# Patient Record
Sex: Male | Born: 1990 | Race: White | Hispanic: No | Marital: Married | Smoking: Never smoker
Health system: Southern US, Community
[De-identification: ages and names within clinical notes are randomized; demographics above are authoritative.]

## PROBLEM LIST (undated history)

## (undated) DIAGNOSIS — S069XAA Unspecified intracranial injury with loss of consciousness status unknown, initial encounter: Secondary | ICD-10-CM

## (undated) HISTORY — PX: SHOULDER ARTHROSCOPY: SHX128

---
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
## 2013-05-27 ENCOUNTER — Encounter: Payer: Self-pay | Admitting: Family Medicine

## 2013-05-27 ENCOUNTER — Ambulatory Visit (INDEPENDENT_AMBULATORY_CARE_PROVIDER_SITE_OTHER): Payer: Federal, State, Local not specified - PPO | Admitting: Family Medicine

## 2013-05-27 VITALS — BP 90/59 | HR 52 | Temp 97.5°F | Ht 71.5 in | Wt 145.4 lb

## 2013-05-27 DIAGNOSIS — Z0000 Encounter for general adult medical examination without abnormal findings: Secondary | ICD-10-CM

## 2013-05-27 NOTE — Patient Instructions (Signed)
Testicular Problems and Self-Exam   Men can examine themselves easily and effectively with positive results. Monthly exams detect problems early and save lives. There are numerous causes of swelling in the testicle. Testicular cancer usually appears as a firm painless lump in the front part of the testicle. This may feel like a dull ache or heavy feeling located in the lower abdomen (belly), groin, or scrotum.   The risk is greater in men with undescended testicles and it is more common in young men. It is responsible for almost a fifth of cancers in males between ages 15 and 34. Other common causes of swellings, lumps, and testicular pain include injuries, inflammation (soreness) from infection, hydrocele, and torsion. These are a few of the reasons to do monthly self-examination of the testicles. The exam only takes minutes and could add years to your life. Get in the habit!   SELF-EXAMINATION OF THE TESTICLES   The testicles are easiest to examine after warm baths or showers and are more difficult to examine when you are cold. This is because the muscles attached to the testicles retract and pull them up higher or into the abdomen. While standing, roll one testicle between the thumb and forefinger. Feel for lumps, swelling, or discomfort. A normal testicle is egg shaped and feels firm. It is smooth and not tender. The spermatic cord can be felt as a firm spaghetti-like cord at the back of the testicle. It is also important to examine your groins. This is the crease between the front of your leg and your abdomen. Also, feel for enlarged lymph nodes (glands). Enlarged nodes are also a cause for you to see your caregiver for evaluation.   Self-examination of the testicles and groin areas on a regular basis will help you to know what your own testicles and groins feel like. This will help you pick up an abnormality (difference) at an earlier stage. Early discovery is the key to curing this cancer or treating other  conditions. Any lump, change, or swelling in the testicle calls for immediate evaluation by your caregiver. Cancer of the testicle does not result in impotence and it does not prevent normal intercourse or prevent having children. If your caregiver feels that medical treatment or chemotherapy could lead to infertility, sperm can be frozen for future use. It is necessary to see a caregiver as soon as possible after the discovery of a lump in a testicle.   Document Released: 12/08/2000 Document Revised: 11/24/2011 Document Reviewed: 09/02/2008   ExitCare® Patient Information ©2014 [employer].

## 2013-05-27 NOTE — Progress Notes (Signed)
  Subjective:    Patient ID: Christian Lang, male    DOB: 1991-08-04, 22 y.o.   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   Review of Systems No chest pain, SOB, HA, dizziness, vision change, N/V, diarrhea, constipation, dysuria, urinary urgency or frequency, myalgias, arthralgias or rash.     Objective:   Physical Exam Vital signs noted  Well developed well nourished male.  HEENT - Head atraumatic Normocephalic                Eyes - PERRLA, Conjuctiva - clear Sclera- Clear EOMI                Ears - EAC's Wnl TM's Wnl Gross Hearing WNL                Nose - Nares patent                 Throat - oropharanx wnl Respiratory - Lungs CTA bilateral Cardiac - RRR S1 and S2 without murmur GI - Abdomen soft Nontender and bowel sounds active x 4 Extremities - No edema. Neuro - Grossly intact.       Assessment & Plan:  Physical exam Clear for [employer] in [place].  Paper work filled out.

## 2013-07-18 ENCOUNTER — Ambulatory Visit (INDEPENDENT_AMBULATORY_CARE_PROVIDER_SITE_OTHER): Payer: Federal, State, Local not specified - PPO | Admitting: Physician Assistant

## 2013-07-18 ENCOUNTER — Ambulatory Visit: Payer: Federal, State, Local not specified - PPO

## 2013-07-18 VITALS — BP 112/68 | HR 74 | Temp 98.0°F | Resp 18 | Ht 71.5 in | Wt 144.2 lb

## 2013-07-18 DIAGNOSIS — M545 Low back pain: Secondary | ICD-10-CM

## 2013-07-18 DIAGNOSIS — S39012A Strain of muscle, fascia and tendon of lower back, initial encounter: Secondary | ICD-10-CM

## 2013-07-18 DIAGNOSIS — S335XXA Sprain of ligaments of lumbar spine, initial encounter: Secondary | ICD-10-CM

## 2013-07-18 MED ORDER — DICLOFENAC SODIUM 75 MG PO TBEC: 75.0000 mg | DELAYED_RELEASE_TABLET | Freq: Two times a day (BID) | ORAL | Status: DC

## 2013-07-18 MED ORDER — CYCLOBENZAPRINE HCL 5 MG PO TABS: 5.0000 mg | ORAL_TABLET | Freq: Three times a day (TID) | ORAL | Status: DC | PRN

## 2013-07-18 MED ORDER — TRAMADOL HCL 50 MG PO TABS: 50.0000 mg | ORAL_TABLET | Freq: Three times a day (TID) | ORAL | Status: DC | PRN

## 2013-07-18 NOTE — Progress Notes (Signed)
   [address], [place] [address]   Phone [phone]  Subjective:    Patient ID: Christian Lang, male    DOB: 03-23-1991, 22 y.o.   MRN: [id number]  HPI Pt presents to clinic with 1 day h/o lumbar back pain after he was chopping wood and after his axe got stuck in a large piece of wood he picked up the piece of wood to disengage the ax and he felt a pull in his back.  He has no pain radiation into his legs or buttocks.  He has taken no medications today.  He has h/o back problems and been on NSAIDs and pain meds and muscle relaxers. He has only had xrays in the past and never an MRI. Increased pain with movements - he cannot find a comfortable position.  No B/B incontience Past history - SI joint problems -  Pt seen at the [place]  Review of Systems  Musculoskeletal: Positive for back pain and gait problem.       Objective:   Physical Exam  Constitutional: He is oriented to person, place, and time.  HENT:  Head: Normocephalic and atraumatic.  Right Ear: External ear normal.  Left Ear: External ear normal.  Pulmonary/Chest: Effort normal.  Musculoskeletal:       Lumbar back: He exhibits decreased range of motion (2nd to pain pt is unable to flex or extend his lumbar spine - he is able to side bend without much discomfort.  Pt has slow movements with any position change.). He exhibits no tenderness, no bony tenderness and no spasm.  Neurological: He is alert and oriented to person, place, and time.  Skin: Skin is warm and dry.  Psychiatric: He has a normal mood and affect. His behavior is normal. Judgment and thought content normal.   [facility] reading (PRIMARY) by  Dr. Alwyn Ren.  Disc space maintained. NL films.     Assessment & Plan:  Lumbar pain - Plan: DG Lumbar Spine 2-3 Views, cyclobenzaprine (FLEXERIL) 5 MG tablet, diclofenac (VOLTAREN) 75 MG EC tablet, traMADol (ULTRAM) 50 MG tablet  Pt to rest and take it easy and use heat on his back.  We discussed reasons to RTC and  worsening symptoms.  He will either return to clinic here or the [place] in 1 week or sooner if symptoms worsen.  Benny Lennert PA-C 07/18/2013 9:43 PM

## 2014-02-20 ENCOUNTER — Ambulatory Visit: Payer: Federal, State, Local not specified - PPO | Admitting: Family Medicine

## 2014-02-20 ENCOUNTER — Encounter: Payer: Self-pay | Admitting: Family Medicine

## 2014-02-20 VITALS — BP 114/72 | HR 44 | Temp 97.3°F | Ht 71.0 in | Wt 146.2 lb

## 2014-02-20 DIAGNOSIS — Z0000 Encounter for general adult medical examination without abnormal findings: Secondary | ICD-10-CM

## 2014-02-20 LAB — POCT URINALYSIS DIPSTICK
Bilirubin, UA: NEGATIVE
Blood, UA: NEGATIVE
Glucose, UA: NEGATIVE
Ketones, UA: NEGATIVE
Leukocytes, UA: NEGATIVE
Nitrite, UA: NEGATIVE
Protein, UA: NEGATIVE
Spec Grav, UA: 1.015
Urobilinogen, UA: NEGATIVE
pH, UA: 7.5
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
## 2014-07-23 ENCOUNTER — Encounter (HOSPITAL_COMMUNITY): Payer: Self-pay | Admitting: Emergency Medicine

## 2014-07-23 ENCOUNTER — Emergency Department (HOSPITAL_COMMUNITY)
Admission: EM | Admit: 2014-07-23 | Discharge: 2014-07-23 | Disposition: A | Discharge status: Home / Self Care | Payer: Federal, State, Local not specified - PPO | Attending: Emergency Medicine | Admitting: Emergency Medicine

## 2014-07-23 DIAGNOSIS — Y9389 Activity, other specified: Secondary | ICD-10-CM | POA: Insufficient documentation

## 2014-07-23 DIAGNOSIS — Y9289 Other specified places as the place of occurrence of the external cause: Secondary | ICD-10-CM | POA: Insufficient documentation

## 2014-07-23 DIAGNOSIS — T593X1A Toxic effect of lacrimogenic gas, accidental (unintentional), initial encounter: Secondary | ICD-10-CM | POA: Insufficient documentation

## 2014-07-23 DIAGNOSIS — T65894A Toxic effect of other specified substances, undetermined, initial encounter: Secondary | ICD-10-CM

## 2014-07-23 DIAGNOSIS — H5713 Ocular pain, bilateral: Secondary | ICD-10-CM | POA: Diagnosis present

## 2014-07-23 DIAGNOSIS — Y998 Other external cause status: Secondary | ICD-10-CM | POA: Insufficient documentation

## 2014-07-23 MED ORDER — TETRACAINE HCL 0.5 % OP SOLN
2.0000 [drp] | Freq: Once | OPHTHALMIC | Status: AC
  Administered 2014-07-23: 2 [drp] via OPHTHALMIC
  Filled 2014-07-23: qty 2
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
## 2015-05-09 ENCOUNTER — Encounter (INDEPENDENT_AMBULATORY_CARE_PROVIDER_SITE_OTHER): Payer: Self-pay

## 2015-05-09 ENCOUNTER — Ambulatory Visit (INDEPENDENT_AMBULATORY_CARE_PROVIDER_SITE_OTHER): Payer: BLUE CROSS/BLUE SHIELD

## 2015-05-09 ENCOUNTER — Ambulatory Visit (INDEPENDENT_AMBULATORY_CARE_PROVIDER_SITE_OTHER): Payer: BLUE CROSS/BLUE SHIELD | Admitting: Family Medicine

## 2015-05-09 ENCOUNTER — Encounter: Payer: Self-pay | Admitting: Family Medicine

## 2015-05-09 VITALS — BP 109/67 | HR 64 | Temp 97.3°F | Ht 71.0 in | Wt 146.2 lb

## 2015-05-09 DIAGNOSIS — R1011 Right upper quadrant pain: Secondary | ICD-10-CM

## 2015-05-09 LAB — POCT UA - MICROSCOPIC ONLY
Bacteria, U Microscopic: NEGATIVE
Casts, Ur, LPF, POC: NEGATIVE
Crystals, Ur, HPF, POC: NEGATIVE
WBC, Ur, HPF, POC: NEGATIVE
Yeast, UA: NEGATIVE

## 2015-05-09 LAB — POCT URINALYSIS DIPSTICK
Bilirubin, UA: NEGATIVE
Glucose, UA: NEGATIVE
Ketones, UA: NEGATIVE
Leukocytes, UA: NEGATIVE
Nitrite, UA: NEGATIVE
Protein, UA: NEGATIVE
Spec Grav, UA: 1.02
Urobilinogen, UA: NEGATIVE
pH, UA: 5
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
## 2015-08-17 ENCOUNTER — Ambulatory Visit: Payer: BLUE CROSS/BLUE SHIELD | Admitting: Family Medicine

## 2015-08-17 ENCOUNTER — Ambulatory Visit (INDEPENDENT_AMBULATORY_CARE_PROVIDER_SITE_OTHER): Payer: BLUE CROSS/BLUE SHIELD | Admitting: Pediatrics

## 2015-08-17 VITALS — BP 136/69 | HR 49 | Temp 97.3°F | Ht 71.0 in | Wt 146.6 lb

## 2015-08-17 DIAGNOSIS — H8110 Benign paroxysmal vertigo, unspecified ear: Secondary | ICD-10-CM | POA: Diagnosis not present

## 2015-08-17 MED ORDER — MECLIZINE HCL 12.5 MG PO TABS: 12.5000 mg | ORAL_TABLET | Freq: Three times a day (TID) | ORAL | Status: DC | PRN
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
## 2015-08-17 NOTE — Patient Instructions (Signed)
Epley Maneuver Self-Care  WHAT IS THE EPLEY MANEUVER?  The Epley maneuver is an exercise you can do to relieve symptoms of benign paroxysmal positional vertigo (BPPV). This condition is often just referred to as vertigo. BPPV is caused by the movement of tiny crystals (canaliths) inside your inner ear. The accumulation and movement of canaliths in your inner ear causes a sudden spinning sensation (vertigo) when you move your head to certain positions. Vertigo usually lasts about 30 seconds. BPPV usually occurs in just one ear. If you get vertigo when you lie on your left side, you probably have BPPV in your left ear. Your health care provider can tell you which ear is involved.   BPPV may be caused by a head injury. Many people older than 50 get BPPV for unknown reasons. If you have been diagnosed with BPPV, your health care provider may teach you how to do this maneuver. BPPV is not life threatening (benign) and usually goes away in time.   WHEN SHOULD I PERFORM THE EPLEY MANEUVER?  You can do this maneuver at home whenever you have symptoms of vertigo. You may do the Epley maneuver up to 3 times a day until your symptoms of vertigo go away.  HOW SHOULD I DO THE EPLEY MANEUVER?  1. Sit on the edge of a bed or table with your back straight. Your legs should be extended or hanging over the edge of the bed or table.    2. Turn your head halfway toward the affected ear.    3. Lie backward quickly with your head turned until you are lying flat on your back. You may want to position a pillow under your shoulders.    4. Hold this position for 30 seconds. You may experience an attack of vertigo. This is normal. Hold this position until the vertigo stops.  5. Then turn your head to the opposite direction until your unaffected ear is facing the floor.    6. Hold this position for 30 seconds. You may experience an attack of vertigo. This is normal. Hold this position until the vertigo stops.  7. Now turn your whole body to  the same side as your head. Hold for another 30 seconds.    8. You can then sit back up.  ARE THERE RISKS TO THIS MANEUVER?  In some cases, you may have other symptoms (such as changes in your vision, weakness, or numbness). If you have these symptoms, stop doing the maneuver and call your health care provider. Even if doing these maneuvers relieves your vertigo, you may still have dizziness. Dizziness is the sensation of light-headedness but without the sensation of movement. Even though the Epley maneuver may relieve your vertigo, it is possible that your symptoms will return within 5 years.  WHAT SHOULD I DO AFTER THIS MANEUVER?  After doing the Epley maneuver, you can return to your normal activities. Ask your doctor if there is anything you should do at home to prevent vertigo. This may include:  · Sleeping with two or more pillows to keep your head elevated.  · Not sleeping on the side of your affected ear.  · Getting up slowly from bed.  · Avoiding sudden movements during the day.  · Avoiding extreme head movement, like looking up or bending over.  · Wearing a cervical collar to prevent sudden head movements.  WHAT SHOULD I DO IF MY SYMPTOMS GET WORSE?  Call your health care provider if your vertigo gets worse. Call your provider right way if   you have other symptoms, including:   · Nausea.  · Vomiting.  · Headache.  · Weakness.  · Numbness.  · Vision changes.     This information is not intended to replace advice given to you by your health care provider. Make sure you discuss any questions you have with your health care provider.     Document Released: 09/06/2013 Document Reviewed: 09/06/2013  Elsevier Interactive Patient Education ©2016 [employer]

## 2017-10-02 ENCOUNTER — Ambulatory Visit (INDEPENDENT_AMBULATORY_CARE_PROVIDER_SITE_OTHER): Payer: Commercial Managed Care - PPO | Admitting: Family Medicine

## 2017-10-02 ENCOUNTER — Ambulatory Visit (INDEPENDENT_AMBULATORY_CARE_PROVIDER_SITE_OTHER): Payer: Commercial Managed Care - PPO

## 2017-10-02 ENCOUNTER — Encounter: Payer: Self-pay | Admitting: Family Medicine

## 2017-10-02 VITALS — BP 127/81 | HR 84 | Temp 97.3°F | Ht 71.0 in | Wt 155.0 lb

## 2017-10-02 DIAGNOSIS — R059 Cough, unspecified: Secondary | ICD-10-CM

## 2017-10-02 DIAGNOSIS — R042 Hemoptysis: Secondary | ICD-10-CM

## 2017-10-02 DIAGNOSIS — J069 Acute upper respiratory infection, unspecified: Secondary | ICD-10-CM | POA: Diagnosis not present

## 2017-10-02 DIAGNOSIS — R05 Cough: Secondary | ICD-10-CM | POA: Diagnosis not present

## 2017-10-02 MED ORDER — BENZONATATE 200 MG PO CAPS: 200.0000 mg | ORAL_CAPSULE | Freq: Two times a day (BID) | ORAL | 0 refills | Status: DC | PRN

## 2017-10-02 NOTE — Patient Instructions (Addendum)
Chest xray did not reveal any acute abnormalities.  I am still waiting on the formal review by the radiologist will contact you once that is available.  For now, I have prescribed you Tessalon Perles to use twice daily if needed for cough.  This does not cause sedation and is safe to use during your work.  You should consider taking a day or 2 off for rest and hydration.  You may take naproxen if needed for pain or fevers.  Consider getting Sudafed from behind the counter to help with sinus pressure/ear fullness.  It appears that you have a viral upper respiratory infection (cold).  Cold symptoms can last up to 2 weeks.    - Get plenty of rest and drink plenty of fluids. - Try to breathe moist air. Use a cold mist humidifier. - Consume warm fluids (soup or tea) to provide relief for a stuffy nose and to loosen phlegm. - For nasal stuffiness, try saline nasal spray or a Neti Pot. Afrin nasal spray can also be used but this product should not be used longer than 3 days or it will cause rebound nasal stuffiness (worsening nasal congestion). - For sore throat pain relief: suck on throat lozenges, hard candy or popsicles; gargle with warm salt water (1/4 tsp. salt per 8 oz. of water); and eat soft, bland foods. - Eat a well-balanced diet. If you cannot, ensure you are getting enough nutrients by taking a daily multivitamin. - Avoid dairy products, as they can thicken phlegm. - Avoid alcohol, as it impairs your body's immune system.  CONTACT YOUR DOCTOR IF YOU EXPERIENCE ANY OF THE FOLLOWING: - High fever - Ear pain - Sinus-type headache - Unusually severe cold symptoms - Cough that gets worse while other cold symptoms improve - Flare up of any chronic lung problem, such as asthma - Your symptoms persist longer than 2 weeks

## 2017-10-02 NOTE — Progress Notes (Signed)
Subjective: CC: cough/ congestion PCP: Junie SpencerHawks, Christy A, FNP RUE:[id number]:Christian Carollee MassedS Lang is a 27 y.o. male presenting to clinic today for:  1. Cold symptoms  Patient reports sinus/nasal congestion, cough, ear pressure/muffling that started 1 week ago.  He reports 2 episodes of scant hemoptysis, the last being this morning.  He reports one episode of nonbloody diarrhea.  He notes chills.  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  He thinks that this compounded his symptoms.  He denies chest congestion, rhinorrhea, headache, SOB, dizziness, rash, nausea, vomiting, fevers, myalgia, sick contacts, recent travel.  Patient has used Mucinex with little relief of symptoms.  Denies history of COPD or asthma.  Denies tobacco use/ exposure.  ROS: Per HPI  Allergies  Allergen Reactions  . Hydrocodone Hives   No past medical history on file.  Current Outpatient Medications:  .  meclizine (ANTIVERT) 12.5 MG tablet, Take 1 tablet (12.5 mg total) by mouth 3 (three) times daily as needed for dizziness., Disp: 30 tablet, Rfl: 0 Social History   Socioeconomic History  . Marital status: Single    Spouse name: Not on file  . Number of children: Not on file  . Years of education: Not on file  . Highest education level: Not on file  Social Needs  . Financial resource strain: Not on file  . Food insecurity - worry: Not on file  . Food insecurity - inability: Not on file  . Transportation needs - medical: Not on file  . Transportation needs - non-medical: Not on file  Occupational History  . Not on file  Tobacco Use  . Smoking status: Never Smoker  . Smokeless tobacco: Never Used  Substance and Sexual Activity  . Alcohol use: No  . Drug use: No  . Sexual activity: Not on file  Other Topics Concern  . Not on file  Social History Narrative  . Not on  file   No family history on file.  Objective: Office vital signs reviewed. BP 127/81   Pulse 84   Temp (!) 97.3 F (36.3 C) (Oral)   Ht 5\' 11"  (1.803 m)   Wt 155 lb (70.3 kg)   SpO2 99%   BMI 21.62 kg/m   Physical Examination:  General: Awake, alert, well nourished, well appearing male, No acute distress HEENT: Normal    Neck: No masses palpated. + right anterior cervical lymph node enlargement    Ears: L Tympanic membranes intact, normal light reflex, no erythema, no bulging; R TM obscured by cerumen/dry skin    Eyes: PERRLA, extraocular membranes intact, sclera white, no ocular discharge    Nose: nasal turbinates moist, no nasal discharge    Throat: moist mucus membranes, mild oropharyngeal erythema, no tonsillar exudate.  Airway is patent Cardio: regular rate and rhythm, S1S2 heard, no murmurs appreciated Pulm: clear to auscultation bilaterally, no wheezes, rhonchi or rales; normal work of breathing on room air  Dg Chest 2 View  Result Date: 10/02/2017 CLINICAL DATA:  Cough and chest congestion.  Hemoptysis. EXAM: CHEST  2 VIEW COMPARISON:  07/23/2004 FINDINGS: The heart size and mediastinal contours are within normal limits. Both lungs are clear. The visualized skeletal structures are unremarkable. IMPRESSION: Normal exam. Electronically Signed   By: Francene BoyersJames  Maxwell M.D.   On: 10/02/2017 09:13    Assessment/ Plan: 27 y.o. male  1. URI with cough and congestion Appears to be viral.  Patient is afebrile well-appearing with normal vital signs during today's visit.  His physical exam was remarkable for a slightly enlarged right anterior cervical lymph node.  Right TM was also obscured by cerumen.  No evidence of bacterial infection on exam.  Because of his reports of 2 episodes of hemoptysis, chest x-ray was obtained to rule out atypical pneumonia versus other pathologic processes.  Personal review of chest x-ray was within normal limits.  Radiology review also noted no acute  pulmonary processes.  Supportive care recommended.  Home care instructions were reviewed with the patient.  Tessalon Perles p.o. twice daily prescribed as needed cough.  Not having any chest congestion so recommended that he actually discontinue Mucinex.  Would consider starting Sudafed for sinus pressure.  A work note was provided excusing for the next 2 days.  I did recommend that he consider rest and hydration in order to recover from this illness. - DG Chest 2 View; Future  2. Hemoptysis - DG Chest 2 View; Future   Orders Placed This Encounter  Procedures  . DG Chest 2 View    Standing Status:   Future    Number of Occurrences:   1    Standing Expiration Date:   12/01/2018    Order Specific Question:   Reason for Exam (SYMPTOM  OR DIAGNOSIS REQUIRED)    Answer:   hemoptysis x2    Order Specific Question:   Preferred imaging location?    Answer:   Internal    Order Specific Question:   Radiology Contrast Protocol - do NOT remove file path    Answer:   [email or web address]   Meds ordered this encounter  Medications  . benzonatate (TESSALON) 200 MG capsule    Sig: Take 1 capsule (200 mg total) by mouth 2 (two) times daily as needed for cough.    Dispense:  20 capsule    Refill:  0     Christian Bojanowski Hulen Skains, DO [facility] [phone]

## 2017-10-06 ENCOUNTER — Telehealth: Payer: Self-pay | Admitting: Family Medicine

## 2017-10-06 ENCOUNTER — Other Ambulatory Visit: Payer: Self-pay | Admitting: Family Medicine

## 2017-10-06 MED ORDER — AZITHROMYCIN 250 MG PO TABS
ORAL_TABLET | ORAL | 0 refills | Status: DC
Start: 2017-10-06 — End: 2018-01-19

## 2017-10-06 NOTE — Telephone Encounter (Signed)
[facility]

## 2017-10-06 NOTE — Telephone Encounter (Signed)
What symptoms do you have? Pain in face, excessive head congestion  How long have you been sick? 11 days  Have you been seen for this problem? Yes, by Dr. Reece AgarG  If your provider decides to give you a prescription, which pharmacy would you like for it to be sent to? [facility]   Patient informed that this information will be sent to the clinical staff for review and that they should receive a follow up call.

## 2017-10-07 ENCOUNTER — Telehealth: Payer: Self-pay | Admitting: Family Medicine

## 2017-10-08 NOTE — Telephone Encounter (Signed)
lmcb- need more information

## 2017-10-08 NOTE — Telephone Encounter (Signed)
Did pt start the Zpak that was sent in on 10/06/17?

## 2017-10-08 NOTE — Telephone Encounter (Signed)
Detailed message left for patient.

## 2017-10-08 NOTE — Telephone Encounter (Signed)
Patient states he is still has a lot of congestion, facial pressure/pain, ears popping, hollow sound like he is talking through water. Patient has tried sudafed and mucinex

## 2017-10-08 NOTE — Telephone Encounter (Signed)
Patient will pick up zpack today and start. Patient had not picked up yet.

## 2018-01-19 ENCOUNTER — Encounter (HOSPITAL_COMMUNITY): Payer: Self-pay | Admitting: *Deleted

## 2018-01-19 ENCOUNTER — Other Ambulatory Visit: Payer: Self-pay

## 2018-01-19 ENCOUNTER — Emergency Department (HOSPITAL_COMMUNITY)
Admission: EM | Admit: 2018-01-19 | Discharge: 2018-01-19 | Disposition: A | Discharge status: Home / Self Care | Payer: Worker's Compensation | Attending: Emergency Medicine | Admitting: Emergency Medicine

## 2018-01-19 ENCOUNTER — Emergency Department (HOSPITAL_COMMUNITY): Payer: Worker's Compensation

## 2018-01-19 DIAGNOSIS — S5412XA Injury of median nerve at forearm level, left arm, initial encounter: Secondary | ICD-10-CM | POA: Diagnosis not present

## 2018-01-19 DIAGNOSIS — R531 Weakness: Secondary | ICD-10-CM | POA: Diagnosis present

## 2018-01-19 DIAGNOSIS — Y999 Unspecified external cause status: Secondary | ICD-10-CM | POA: Insufficient documentation

## 2018-01-19 DIAGNOSIS — Y9389 Activity, other specified: Secondary | ICD-10-CM | POA: Diagnosis not present

## 2018-01-19 DIAGNOSIS — R202 Paresthesia of skin: Secondary | ICD-10-CM | POA: Diagnosis not present

## 2018-01-19 DIAGNOSIS — Y828 Other medical devices associated with adverse incidents: Secondary | ICD-10-CM | POA: Insufficient documentation

## 2018-01-19 DIAGNOSIS — Y92538 Other ambulatory health services establishments as the place of occurrence of the external cause: Secondary | ICD-10-CM | POA: Insufficient documentation

## 2018-01-19 MED ORDER — IOPAMIDOL (ISOVUE-370) INJECTION 76%
100.0000 mL | Freq: Once | INTRAVENOUS | Status: AC | PRN
  Administered 2018-01-19: 100 mL via INTRAVENOUS

## 2018-01-19 NOTE — ED Provider Notes (Signed)
Emergency Department Provider Note   I have reviewed the triage vital signs and the nursing notes.   HISTORY  Chief Complaint Extremity Weakness   HPI Christian Lang is a 27 y.o. male who gave blood earlier today and will given blood had a little bit worse pain than normal but other than that the blood draw was uneventful.  Afterwards she has a little weakness in his arm which was used to paresthesias as well.  However the next 5 to 6 hours he has had intermittent episodes of feel like his hand is cold even though his wife touches it and it is not.  He had multiple episodes of paresthesias down his arm and also decreased grip strength.  He talked to a tele-doc who told to come the emergency room for evaluation.  He did note that at one point it seemed like his arms were different colors when they hurt.  They took a picture however it is difficult to tell if they are truly different colors.  There is definitely not severe cyanosis in the pictures. No other associated or modifying symptoms.    History reviewed. No pertinent past medical history.  There are no active problems to display for this patient.   Past Surgical History:  Procedure Laterality Date  . SHOULDER ARTHROSCOPY Left       Allergies Hydrocodone  No family history on file.  Social History Social History   Tobacco Use  . Smoking status: Never Smoker  . Smokeless tobacco: Never Used  Substance Use Topics  . Alcohol use: No  . Drug use: No    Review of Systems  All other systems negative except as documented in the HPI. All pertinent positives and negatives as reviewed in the HPI. ____________________________________________   PHYSICAL EXAM:  VITAL SIGNS: ED Triage Vitals  Enc Vitals Group     BP 01/19/18 2020 136/82     Pulse Rate 01/19/18 2020 61     Resp 01/19/18 2020 18     Temp 01/19/18 2020 98.2 F (36.8 C)     Temp src --      SpO2 01/19/18 2020 99 %     Weight 01/19/18 2021 153  lb (69.4 kg)     Height 01/19/18 2021 6' (1.829 m)    Constitutional: Alert and oriented. Well appearing and in no acute distress. Eyes: Conjunctivae are normal. PERRL. EOMI. Head: Atraumatic. Nose: No congestion/rhinnorhea. Mouth/Throat: Mucous membranes are moist.  Oropharynx non-erythematous. Neck: No stridor.  No meningeal signs.   Cardiovascular: Normal rate, regular rhythm. Good peripheral circulation. Grossly normal heart sounds.   Respiratory: Normal respiratory effort.  No retractions. Lungs CTAB. Gastrointestinal: Soft and nontender. No distention.  Musculoskeletal: No lower extremity tenderness nor edema. No gross deformities of extremities. Neurologic:  Normal speech and language. No gross focal neurologic deficits are appreciated. Decreased left grip strength and wrist flexion. Skin:  Skin is warm, dry and intact. No rash noted.  ____________________________________________  RADIOLOGY  Ct Angio Up Extrem Left W &/or Wo Contast  Result Date: 01/19/2018 CLINICAL DATA:  27 year old male is EXAM: CT ANGIOGRAPHY OF THE UPPER LEFT EXTREMITY WITHOUT CONTRAST TECHNIQUE: Multidetector CT imaging of the upper left extremity was performed following intravenous administration of contrast according to the standard protocol. CONTRAST:  100 cc Isovue 370 COMPARISON:  None. FINDINGS: Bones/Joint/Cartilage There is no acute fracture or dislocation. The bones are well mineralized. No arthritic changes. No joint effusion. Ligaments Suboptimally assessed by CT. Muscles and Tendons  No acute findings.  No intramuscular fluid collection or hematoma. Soft tissues There is mild subcutaneous edema at the wrist.  No fluid collection. The visualized left axillary, brachial, radial and ulnar arteries are unremarkable and patent to the level of the wrist. IMPRESSION: No acute findings.  No fluid collection. Electronically Signed   By: Elgie Collard M.D.   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  Discussed with Dr. Laurence Slate at [facility] neurology who says he needs outpatient neurology follow-up for likely nerve conduction studies.  No acute interventions. CT w/o e/o vascular injury.    Pertinent labs & imaging results that were available during my care of the patient were reviewed by me and considered in my medical decision making (see chart for details).  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   Note:  This note was prepared with assistance of Dragon voice recognition software. Occasional wrong-word or sound-a-like substitutions may have occurred due to the inherent limitations of voice recognition software.   Amdrew Oboyle, Barbara Cower, MD 01/20/18 0005

## 2018-01-19 NOTE — ED Triage Notes (Addendum)
Pt reports having his blood taken earlier today and his left arm has been numb, cold and weak ever since. Pt states he has a hard time even holding a cup.

## 2018-07-19 ENCOUNTER — Encounter: Payer: Self-pay | Admitting: Family Medicine

## 2018-07-19 ENCOUNTER — Ambulatory Visit (INDEPENDENT_AMBULATORY_CARE_PROVIDER_SITE_OTHER): Payer: Commercial Managed Care - PPO | Admitting: Family Medicine

## 2018-07-19 VITALS — BP 104/56 | HR 56 | Temp 97.1°F | Ht 72.0 in | Wt 157.0 lb

## 2018-07-19 DIAGNOSIS — H6593 Unspecified nonsuppurative otitis media, bilateral: Secondary | ICD-10-CM

## 2018-07-19 MED ORDER — FLUTICASONE PROPIONATE 50 MCG/ACT NA SUSP: 2.0000 | Freq: Every day | NASAL | 6 refills | Status: DC

## 2018-07-19 MED ORDER — CETIRIZINE HCL 10 MG PO TABS: 10.0000 mg | ORAL_TABLET | Freq: Every day | ORAL | 11 refills | Status: DC

## 2018-07-19 NOTE — Progress Notes (Signed)
Subjective:    Patient ID: Christian Lang, male    DOB: [birth year], 27 y.o.   MRN: [id number]  Chief Complaint:  Wants ears checked   HPI: Christian Lang is a 27 y.o. male presenting on 07/19/2018 for Wants ears checked   1. Bilateral otitis media with effusion   Pt presents today with complaints of clicking noises in ears. States on 07-15-18 he had an episode of dizziness when he got up from bed. States this subsided. Denies nausea or vomiting with the dizziness. States the room felt as if it was spinning. States since this episode he has had the popping and clicking noises in his ears. States this is intermittent. Denies weakness, numbness, tingling, focal neuro deficits, or headaches.   Relevant past medical, surgical, family, and social history reviewed and updated as indicated.  Allergies and medications reviewed and updated.   History reviewed. No pertinent past medical history.  Past Surgical History:  Procedure Laterality Date  . SHOULDER ARTHROSCOPY Left     Social History   Socioeconomic History  . Marital status: Single    Spouse name: Not on file  . Number of children: Not on file  . Years of education: Not on file  . Highest education level: Not on file  Occupational History  . Not on file  Social Needs  . Financial resource strain: Not on file  . Food insecurity:    Worry: Not on file    Inability: Not on file  . Transportation needs:    Medical: Not on file    Non-medical: Not on file  Tobacco Use  . Smoking status: Never Smoker  . Smokeless tobacco: Never Used  Substance and Sexual Activity  . Alcohol use: No  . Drug use: No  . Sexual activity: Not on file  Lifestyle  . Physical activity:    Days per week: Not on file    Minutes per session: Not on file  . Stress: Not on file  Relationships  . Social connections:    Talks on phone: Not on file    Gets together: Not on file    Attends religious service: Not on file    Active  member of club or organization: Not on file    Attends meetings of clubs or organizations: Not on file    Relationship status: Not on file  . Intimate partner violence:    Fear of current or ex partner: Not on file    Emotionally abused: Not on file    Physically abused: Not on file    Forced sexual activity: Not on file  Other Topics Concern  . Not on file  Social History Narrative  . Not on file    Outpatient Encounter Medications as of 07/19/2018  Medication Sig  . cetirizine (ZYRTEC) 10 MG tablet Take 1 tablet (10 mg total) by mouth daily.  . fluticasone (FLONASE) 50 MCG/ACT nasal spray Place 2 sprays into both nostrils daily.   No facility-administered encounter medications on file as of 07/19/2018.     Allergies  Allergen Reactions  . Hydrocodone Hives    Review of Systems  Constitutional: Negative for appetite change, chills, fatigue and fever.  HENT: Positive for tinnitus. Negative for congestion, postnasal drip, rhinorrhea, sinus pressure and sinus pain.   Eyes: Negative for photophobia and visual disturbance.  Respiratory: Negative for cough, chest tightness and shortness of breath.   Cardiovascular: Negative for chest pain, palpitations and leg swelling.  Gastrointestinal:  Negative for nausea and vomiting.  Neurological: Positive for dizziness. Negative for tremors, seizures, syncope, facial asymmetry, speech difficulty, weakness, light-headedness, numbness and headaches.  All other systems reviewed and are negative.       Objective:    BP (!) 104/56   Pulse (!) 56   Temp (!) 97.1 F (36.2 C) (Oral)   Ht 6' (1.829 m)   Wt 157 lb (71.2 kg)   BMI 21.29 kg/m    Wt Readings from Last 3 Encounters:  07/19/18 157 lb (71.2 kg)  01/19/18 153 lb (69.4 kg)  10/02/17 155 lb (70.3 kg)    Physical Exam  Constitutional: He is oriented to person, place, and time. He appears well-developed and well-nourished. He is cooperative. No distress.  HENT:  Head:  Normocephalic and atraumatic.  Right Ear: Hearing, external ear and ear canal normal. Tympanic membrane is not injected, not perforated and not erythematous. A middle ear effusion is present.  Left Ear: Hearing, external ear and ear canal normal. Tympanic membrane is not injected, not perforated and not erythematous. A middle ear effusion is present.  Nose: Nose normal. Right sinus exhibits no maxillary sinus tenderness and no frontal sinus tenderness. Left sinus exhibits no maxillary sinus tenderness and no frontal sinus tenderness.  Mouth/Throat: Uvula is midline, oropharynx is clear and moist and mucous membranes are normal. No tonsillar exudate.  Eyes: Pupils are equal, round, and reactive to light. Conjunctivae, EOM and lids are normal. Right eye exhibits normal extraocular motion and no nystagmus. Left eye exhibits normal extraocular motion and no nystagmus.  Neck: Trachea normal, full passive range of motion without pain and phonation normal. Neck supple. No JVD present. Carotid bruit is not present. No thyroid mass and no thyromegaly present.  Cardiovascular: Normal rate, regular rhythm and normal heart sounds. Exam reveals no gallop and no friction rub.  No murmur heard. Pulmonary/Chest: Effort normal and breath sounds normal. No respiratory distress.  Musculoskeletal: Normal range of motion.  Lymphadenopathy:    He has no cervical adenopathy.  Neurological: He is alert and oriented to person, place, and time. He has normal strength. No cranial nerve deficit or sensory deficit. He displays a negative Romberg sign. Coordination and gait normal.  Skin: Skin is warm and dry. Capillary refill takes less than 2 seconds.  Psychiatric: He has a normal mood and affect. His speech is normal and behavior is normal. Judgment and thought content normal. Cognition and memory are normal.  Nursing note and vitals reviewed.       Pertinent labs & imaging results that were available during my care of  the patient were reviewed by me and considered in my medical decision making.  Assessment & Plan:  [name] was seen today for wants ears checked.  Diagnoses and all orders for this visit:  Bilateral otitis media with effusion Avoid second hand smoke. Medications as prescribed. Return if symptoms fail to improve.  -     fluticasone (FLONASE) 50 MCG/ACT nasal spray; Place 2 sprays into both nostrils daily. -     cetirizine (ZYRTEC) 10 MG tablet; Take 1 tablet (10 mg total) by mouth daily.      Continue all other maintenance medications.  Follow up plan: Return in about 4 weeks (around 08/16/2018), or if symptoms worsen or fail to improve.  Educational handout given for otitis media with effusion  The above assessment and management plan was discussed with the patient. The patient verbalized understanding of and has agreed to the management plan.  Patient is aware to call the clinic if symptoms persist or worsen. Patient is aware when to return to the clinic for a follow-up visit. Patient educated on when it is appropriate to go to the emergency department.   Kari Baars, FNP-C [facility] [phone]

## 2018-07-19 NOTE — Patient Instructions (Signed)
Otitis Media With Effusion Otitis media with effusion (OME) occurs when there is inflammation of the middle ear and fluid in the middle ear space. There are no signs and symptoms of infection. The middle ear space contains air and the bones for hearing. Air in the middle ear space helps to transmit sound to the brain. OME is a common condition in children, and it often occurs after an ear infection. This condition may be present for several weeks or longer after an ear infection. Most cases of this condition get better on their own. What are the causes? OME is caused by a blockage of the eustachian tube in one or both ears. These tubes drain fluid in the ears to the back of the nose (nasopharynx). If the tissue in the tube swells up (edema), the tube closes. This prevents fluid from draining. Blockage can be caused by:  Ear infections.  Colds and other upper respiratory infections.  Allergies.  Irritants, such as tobacco smoke.  Enlarged adenoids. The adenoids are areas of soft tissue located high in the back of the throat, behind the nose and the roof of the mouth. They are part of the body's natural defense (immune) system.  A mass in the nasopharynx.  Damage to the ear caused by pressure changes (barotrauma).  What increases the risk? Your child is more likely to develop this condition if:  He or she has repeated ear and sinus infections.  He or she has allergies.  He or she is exposed to tobacco smoke.  He or she attends daycare.  He or she is not breastfed.  What are the signs or symptoms? Symptoms of this condition may not be obvious. Sometimes this condition does not have any symptoms, or symptoms may overlap with those of a cold or upper respiratory tract illness. Symptoms of this condition include:  Temporary hearing loss.  A feeling of fullness in the ear without pain.  Irritability or agitation.  Balance (vestibular) problems.  As a result of hearing loss, your  child may:  Listen to the TV at a loud volume.  Not respond to questions.  Ask "What?" often when spoken to.  Mistake or confuse one sound or word for another.  Perform poorly at school.  Have a poor attention span.  Become agitated or irritated easily.  How is this diagnosed? This condition is diagnosed with an ear exam. Your child's health care provider will look inside your child's ear with an instrument (otoscope) to check for redness, swelling, and fluid. Other tests may be done, including:  A test to check the movement of the eardrum (pneumatic otoscopy). This is done by squeezing a small amount of air into the ear.  A test that changes air pressure in the middle ear to check how well the eardrum moves and to see if the eustachian tube is working (tympanogram).  Hearing test (audiogram). This test involves playing tones at different pitches to see if your child can hear each tone.  How is this treated? Treatment for this condition depends on the cause. In many cases, the fluid goes away on its own. In some cases, your child may need a procedure to create a hole in the eardrum to allow fluid to drain (myringotomy) and to insert small drainage tubes (tympanostomy tubes) into the eardrums. These tubes help to drain fluid and prevent infection. This procedure may be recommended if:  OME does not get better over several months.  Your child has many ear infections   within several months.  Your child has noticeable hearing loss.  Your child has problems with speech and language development.  Surgery may also be done to remove the adenoids (adenoidectomy). Follow these instructions at home:  Give over-the-counter and prescription medicines only as told by your child's health care provider.  Keep children away from any tobacco smoke.  Keep all follow-up visits as told by your child's health care provider. This is important. How is this prevented?  Keep your child's  vaccinations up to date. Make sure your child gets all recommended vaccinations, including a pneumonia and flu vaccine.  Encourage hand washing. Your child should wash his or her hands often with soap and water. If there is no soap and water, he or she should use hand sanitizer.  Avoid exposing your child to tobacco smoke.  Breastfeed your baby, if possible. Babies who are breastfed as long as possible are less likely to develop this condition. Contact a health care provider if:  Your child's hearing does not get better after 3 months.  Your child's hearing is worse.  Your child has ear pain.  Your child has a fever.  Your child has drainage from the ear.  Your child is dizzy.  Your child has a lump on his or her neck. Get help right away if:  Your child has bleeding from the nose.  Your child cannot move part of her or his face.  Your child has trouble breathing.  Your child cannot smell.  Your child develops severe congestion.  Your child develops weakness.  Your child who is younger than 3 months has a temperature of 100F (38C) or higher. Summary  Otitis media with effusion (OME) occurs when there is inflammation of the middle ear and fluid in the middle ear space.  This condition is caused by blockage of one or both eustachian tubes, which drain fluid in the ears to the back of the nose.  Symptoms of this condition can include temporary hearing loss, a feeling of fullness in the ear, irritability or agitation, and balance (vestibular) problems. Sometimes, there are no symptoms.  This condition is diagnosed with an ear exam and tests, such as pneumatic otoscopy, tympanogram, and audiogram.  Treatment for this condition depends on the cause. In many cases, the fluid goes away on its own. This information is not intended to replace advice given to you by your health care provider. Make sure you discuss any questions you have with your health care provider. Document  Released: 11/22/2003 Document Revised: 07/24/2016 Document Reviewed: 07/24/2016 Elsevier Interactive Patient Education  2017 [employer]  

## 2018-11-12 ENCOUNTER — Encounter: Payer: Self-pay | Admitting: Nurse Practitioner

## 2018-11-12 ENCOUNTER — Ambulatory Visit (INDEPENDENT_AMBULATORY_CARE_PROVIDER_SITE_OTHER): Payer: Commercial Managed Care - PPO | Admitting: Nurse Practitioner

## 2018-11-12 VITALS — BP 126/77 | HR 53 | Temp 96.7°F | Ht 72.0 in | Wt 162.0 lb

## 2018-11-12 DIAGNOSIS — H3509 Other intraretinal microvascular abnormalities: Secondary | ICD-10-CM | POA: Diagnosis not present

## 2018-11-12 NOTE — Progress Notes (Signed)
   Subjective:    Patient ID: Christian Lang, male    DOB: 26-Jun-1991, 28 y.o.   MRN: [id number]   Chief Complaint: Right eye red   HPI Patient come sin today c/o of red streak in his right eye. He though he had something in it 1 week ago and now has red line that has ben there for several days. He denies pain, itching or irritation.   Review of Systems  Constitutional: Negative.   Eyes: Negative for photophobia, pain, discharge, redness and itching.  Respiratory: Negative.   Cardiovascular: Negative.   Genitourinary: Negative.   Neurological: Negative.   Psychiatric/Behavioral: Negative.   All other systems reviewed and are negative.      Objective:   Physical Exam Vitals signs and nursing note reviewed.  Constitutional:      Appearance: He is normal weight.  Eyes:     Extraocular Movements: Extraocular movements intact.     Conjunctiva/sclera: Conjunctivae normal.     Pupils: Pupils are equal, round, and reactive to light.     Comments: Visible blood vesicles inner aspect of right sclera  Cardiovascular:     Rate and Rhythm: Normal rate and regular rhythm.     Heart sounds: Normal heart sounds.  Pulmonary:     Effort: Pulmonary effort is normal.     Breath sounds: Normal breath sounds.  Skin:    General: Skin is warm and dry.  Neurological:     General: No focal deficit present.     Mental Status: He is alert and oriented to person, place, and time.  Psychiatric:        Mood and Affect: Mood normal.        Behavior: Behavior normal.    BP 126/77   Pulse (!) 53   Temp (!) 96.7 F (35.9 C) (Oral)   Ht 6' (1.829 m)   Wt 162 lb (73.5 kg)   BMI 21.97 kg/m         Assessment & Plan:  Christian Lang in today with chief complaint of Right eye red   1. Abnormal blood vessels in right eye No rubbing RTO prn  Christian Daphine Deutscher, FNP

## 2020-01-23 ENCOUNTER — Other Ambulatory Visit: Payer: Self-pay

## 2020-01-23 ENCOUNTER — Encounter (HOSPITAL_COMMUNITY): Payer: Self-pay | Admitting: *Deleted

## 2020-01-23 ENCOUNTER — Emergency Department (HOSPITAL_COMMUNITY)
Admission: EM | Admit: 2020-01-23 | Discharge: 2020-01-23 | Disposition: A | Discharge status: Home / Self Care | Payer: Commercial Managed Care - PPO | Attending: Emergency Medicine | Admitting: Emergency Medicine

## 2020-01-23 DIAGNOSIS — R197 Diarrhea, unspecified: Secondary | ICD-10-CM | POA: Diagnosis not present

## 2020-01-23 DIAGNOSIS — R112 Nausea with vomiting, unspecified: Secondary | ICD-10-CM | POA: Diagnosis not present

## 2020-01-23 DIAGNOSIS — E860 Dehydration: Secondary | ICD-10-CM | POA: Diagnosis not present

## 2020-01-23 DIAGNOSIS — Z79899 Other long term (current) drug therapy: Secondary | ICD-10-CM | POA: Insufficient documentation

## 2020-01-23 DIAGNOSIS — R195 Other fecal abnormalities: Secondary | ICD-10-CM | POA: Diagnosis present

## 2020-01-23 LAB — COMPREHENSIVE METABOLIC PANEL
ALT: 33 U/L (ref 0–44)
AST: 33 U/L (ref 15–41)
Albumin: 4.7 g/dL (ref 3.5–5.0)
Alkaline Phosphatase: 55 U/L (ref 38–126)
Anion gap: 11 (ref 5–15)
BUN: 28 mg/dL — ABNORMAL HIGH (ref 6–20)
CO2: 27 mmol/L (ref 22–32)
Calcium: 9.3 mg/dL (ref 8.9–10.3)
Chloride: 101 mmol/L (ref 98–111)
Creatinine, Ser: 1.17 mg/dL (ref 0.61–1.24)
GFR calc Af Amer: >60 mL/min (ref >60)
GFR calc non Af Amer: >60 mL/min (ref >60)
Glucose, Bld: 129 mg/dL — ABNORMAL HIGH (ref 70–99)
Potassium: 4 mmol/L (ref 3.5–5.1)
Sodium: 139 mmol/L (ref 135–145)
Total Bilirubin: 0.9 mg/dL (ref 0.3–1.2)
Total Protein: 8.3 g/dL — ABNORMAL HIGH (ref 6.5–8.1)

## 2020-01-23 LAB — CBC WITH DIFFERENTIAL/PLATELET
Abs Immature Granulocytes: 0.06 10*3/uL (ref 0.00–0.07)
Basophils Absolute: 0 10*3/uL (ref 0.0–0.1)
Basophils Relative: 0 %
Eosinophils Absolute: 0 10*3/uL (ref 0.0–0.5)
Eosinophils Relative: 0 %
HCT: 47.5 % (ref 39.0–52.0)
Hemoglobin: 15.4 g/dL (ref 13.0–17.0)
Immature Granulocytes: 0 %
Lymphocytes Relative: 3 %
Lymphs Abs: 0.5 10*3/uL — ABNORMAL LOW (ref 0.7–4.0)
MCH: 29.1 pg (ref 26.0–34.0)
MCHC: 32.4 g/dL (ref 30.0–36.0)
MCV: 89.6 fL (ref 80.0–100.0)
Monocytes Absolute: 0.8 10*3/uL (ref 0.1–1.0)
Monocytes Relative: 4 %
Neutro Abs: 16.6 10*3/uL — ABNORMAL HIGH (ref 1.7–7.7)
Neutrophils Relative %: 93 %
Platelets: 205 10*3/uL (ref 150–400)
RBC: 5.3 MIL/uL (ref 4.22–5.81)
RDW: 13 % (ref 11.5–15.5)
WBC: 18 10*3/uL — ABNORMAL HIGH (ref 4.0–10.5)
nRBC: 0 % (ref 0.0–0.2)

## 2020-01-23 LAB — LIPASE, BLOOD: Lipase: 28 U/L (ref 11–51)

## 2020-01-23 MED ORDER — SODIUM CHLORIDE 0.9 % IV BOLUS
1000.0000 mL | Freq: Once | INTRAVENOUS | Status: AC
  Administered 2020-01-23: 1000 mL via INTRAVENOUS

## 2020-01-23 MED ORDER — LOPERAMIDE HCL 2 MG PO CAPS
4.0000 mg | ORAL_CAPSULE | Freq: Once | ORAL | Status: AC
  Administered 2020-01-23: 4 mg via ORAL
  Filled 2020-01-23: qty 2

## 2020-01-23 MED ORDER — ONDANSETRON HCL 4 MG/2ML IJ SOLN
4.0000 mg | Freq: Once | INTRAMUSCULAR | Status: AC
  Administered 2020-01-23: 4 mg via INTRAVENOUS
  Filled 2020-01-23: qty 2

## 2020-01-23 MED ORDER — ONDANSETRON HCL 4 MG PO TABS: 4.0000 mg | ORAL_TABLET | Freq: Three times a day (TID) | ORAL | 0 refills | Status: DC | PRN

## 2020-01-23 NOTE — ED Triage Notes (Signed)
Pt c/o n//v/d, abd pain that started tonight, states that his wife is sick with same symptoms, denies any fever,

## 2020-01-23 NOTE — ED Provider Notes (Signed)
[facility] EMERGENCY DEPARTMENT Provider Note   CSN: [id number] Arrival date & time: 01/23/20  0034   Time seen 12:50 AM  History Chief Complaint  Patient presents with  . Emesis    Christian Lang is a 29 y.o. male.  HPI   Patient states that he ate fresh salmon that they bought from [employer] tonight and he said "it was the best I have ever had".  However he noted when he was eating he had acute onset of bloating and feeling full earlier than he would normally for eating the same amount.  He states about 9:30 PM about an hour after eating salmon he felt the need to have a bowel movement and states it took about 15 minutes and then he had some black water come out and he felt better.  He then acutely had dry heaves followed by projectile vomiting at least 6 times.  He states he then felt fine and drank some Gatorade.  His wife gave him some Emetrol and then he again had the watery diarrhea followed by dry heaves and vomiting.  He states this cycle of feeling good and then feeling bad happened about 4-5 times in 2 hours.  He states he has vomited 31 times.  He states he also was feeling hot and then cold.  He states he had some dizziness and lightheadedness but he has had that before.  He states his mouth feels dry.  He states about 30 minutes after he got sick his wife had some vomiting but she never had abdominal pain or diarrhea.  His 74-year-old son also ate the fish but he has not been sick yet.  PCP Junie Spencer, FNP   History reviewed. No pertinent past medical history.  There are no problems to display for this patient.   Past Surgical History:  Procedure Laterality Date  . SHOULDER ARTHROSCOPY Left        No family history on file.  Social History   Tobacco Use  . Smoking status: Never Smoker  . Smokeless tobacco: Never Used  Substance Use Topics  . Alcohol use: No  . Drug use: No    Home Medications Prior to Admission medications     Medication Sig Start Date End Date Taking? Authorizing Provider  fexofenadine (ALLEGRA) 60 MG tablet Take 60 mg by mouth daily.   Yes [provider]  MULTIPLE VITAMIN PO Take 1 tablet by mouth daily.   Yes [provider]  cetirizine (ZYRTEC) 10 MG tablet Take 1 tablet (10 mg total) by mouth daily. Patient not taking: Reported on 11/12/2018 07/19/18   Sonny Masters, FNP  fluticasone [facility]) 50 MCG/ACT nasal spray Place 2 sprays into both nostrils daily. Patient not taking: Reported on 11/12/2018 07/19/18   Sonny Masters, FNP  ondansetron (ZOFRAN) 4 MG tablet Take 1 tablet (4 mg total) by mouth every 8 (eight) hours as needed for nausea or vomiting. 01/23/20   Devoria Albe, MD    Allergies    Hydrocodone  Review of Systems   Review of Systems  All other systems reviewed and are negative.   Physical Exam Updated Vital Signs BP 111/65   Pulse 97   Temp 98.2 F (36.8 C) (Oral)   Resp 16   Ht 6' (1.829 m)   Wt 70.3 kg   SpO2 98%   BMI 21.02 kg/m   Physical Exam Vitals and nursing note reviewed.  Constitutional:      Appearance:  Normal appearance. He is normal weight.  HENT:     Head: Normocephalic and atraumatic.     Right Ear: External ear normal.     Left Ear: External ear normal.     Nose: Nose normal.     Mouth/Throat:     Mouth: Mucous membranes are dry.  Eyes:     Extraocular Movements: Extraocular movements intact.     Conjunctiva/sclera: Conjunctivae normal.     Pupils: Pupils are equal, round, and reactive to light.  Cardiovascular:     Rate and Rhythm: Normal rate and regular rhythm.     Pulses: Normal pulses.  Pulmonary:     Effort: Pulmonary effort is normal. No respiratory distress.     Breath sounds: Normal breath sounds.  Abdominal:     General: Abdomen is flat. Bowel sounds are normal.     Palpations: Abdomen is soft.     Tenderness: There is no abdominal tenderness.  Musculoskeletal:        General: Normal range of motion.      Cervical back: Normal range of motion. No rigidity.  Skin:    General: Skin is warm.  Neurological:     General: No focal deficit present.     Mental Status: He is alert and oriented to person, place, and time.     Cranial Nerves: No cranial nerve deficit.  Psychiatric:        Mood and Affect: Mood normal.        Behavior: Behavior normal.        Thought Content: Thought content normal.     ED Results / Procedures / Treatments   Labs (all labs ordered are listed, but only abnormal results are displayed) Results for orders placed or performed during the hospital encounter of 01/23/20  Comprehensive metabolic panel  Result Value Ref Range   Sodium 139 135 - 145 mmol/L   Potassium 4.0 3.5 - 5.1 mmol/L   Chloride 101 98 - 111 mmol/L   CO2 27 22 - 32 mmol/L   Glucose, Bld 129 (H) 70 - 99 mg/dL   BUN 28 (H) 6 - 20 mg/dL   Creatinine, Ser 9.70 0.61 - 1.24 mg/dL   Calcium 9.3 8.9 - 26.3 mg/dL   Total Protein 8.3 (H) 6.5 - 8.1 g/dL   Albumin 4.7 3.5 - 5.0 g/dL   AST 33 15 - 41 U/L   ALT 33 0 - 44 U/L   Alkaline Phosphatase 55 38 - 126 U/L   Total Bilirubin 0.9 0.3 - 1.2 mg/dL   GFR calc non Af Amer >60 >60 mL/min   GFR calc Af Amer >60 >60 mL/min   Anion gap 11 5 - 15  Lipase, blood  Result Value Ref Range   Lipase 28 11 - 51 U/L  CBC with Differential  Result Value Ref Range   WBC 18.0 (H) 4.0 - 10.5 K/uL   RBC 5.30 4.22 - 5.81 MIL/uL   Hemoglobin 15.4 13.0 - 17.0 g/dL   HCT 78.5 88.5 - 02.7 %   MCV 89.6 80.0 - 100.0 fL   MCH 29.1 26.0 - 34.0 pg   MCHC 32.4 30.0 - 36.0 g/dL   RDW 74.1 28.7 - 86.7 %   Platelets 205 150 - 400 K/uL   nRBC 0.0 0.0 - 0.2 %   Neutrophils Relative % 93 %   Neutro Abs 16.6 (H) 1.7 - 7.7 K/uL   Lymphocytes Relative 3 %   Lymphs Abs 0.5 (L) 0.7 -  4.0 K/uL   Monocytes Relative 4 %   Monocytes Absolute 0.8 0.1 - 1.0 K/uL   Eosinophils Relative 0 %   Eosinophils Absolute 0.0 0.0 - 0.5 K/uL   Basophils Relative 0 %   Basophils Absolute 0.0 0.0  - 0.1 K/uL   Immature Granulocytes 0 %   Abs Immature Granulocytes 0.06 0.00 - 0.07 K/uL   Laboratory interpretation all normal except elevation of BUN consistent with dehydration, nonfasting hyperglycemia, leukocytosis consistent with vomiting    EKG None  Radiology No results found.  Procedures Procedures (including critical care time)  Medications Ordered in ED Medications  sodium chloride 0.9 % bolus 1,000 mL (0 mLs Intravenous Stopped 01/23/20 0322)  sodium chloride 0.9 % bolus 1,000 mL (0 mLs Intravenous Stopped 01/23/20 0322)  ondansetron (ZOFRAN) injection 4 mg (4 mg Intravenous Given 01/23/20 0159)  loperamide (IMODIUM) capsule 4 mg (4 mg Oral Given 01/23/20 0159)  sodium chloride 0.9 % bolus 1,000 mL (0 mLs Intravenous Stopped 01/23/20 0451)  ondansetron (ZOFRAN) injection 4 mg (4 mg Intravenous Given 01/23/20 3295)    ED Course  I have reviewed the triage vital signs and the nursing notes.  Pertinent labs & imaging results that were available during my care of the patient were reviewed by me and considered in my medical decision making (see chart for details).    MDM Rules/Calculators/A&P                      Patient was given IV fluids, IV Zofran and oral Imodium.  Laboratory testing was done  Recheck at 3:00 AM he states he is feeling cold with the IV fluids.  However he states he is doing better.  He states he had one episode of mild diarrhea just prior to getting his IV started and he did have a small episode of dry heaves.  He denies nausea now and has very minimal abdominal discomfort.  He reports that he did have urinary output but it was extremely small.  He was given an additional bag IV fluids and I have advised the nursing staff he can drink if he wants.  He was also instructed to let the nurse know if he has diarrhea again so he can get a second dose of the Imodium.  Recheck at 4:30 AM patient was in the bathroom  Recheck at 4:50 AM patient states he has had  urinary output twice and about a normal amount.  He states he has been unable to drink because he still has nausea.  He has not had any more diarrhea.  He has not had any more vomiting.  Patient was given more nausea medication.  Recheck 5:30 AM patient is resting comfortably.  He looks like he feels much improved.  He has been able to drink oral fluids since the second dose of Zofran.  He has had no more vomiting or diarrhea.  At this point I feel like he is ready to be discharged.   Final Clinical Impression(s) / ED Diagnoses Final diagnoses:  Nausea vomiting and diarrhea  Dehydration    Rx / DC Orders ED Discharge Orders         Ordered    ondansetron (ZOFRAN) 4 MG tablet  Every 8 hours PRN     01/23/20 0556        OTC Imodium  Plan discharge  Rolland Porter, MD, Barbette Or, MD 01/23/20 [phone]

## 2020-01-23 NOTE — ED Notes (Signed)
Pt reports relief from diarrhea and emesis following medication administration.

## 2020-01-23 NOTE — ED Notes (Signed)
ED Provider at bedside. 

## 2020-01-23 NOTE — Discharge Instructions (Addendum)
Drink plenty of fluids (clear liquids) then start a bland diet later this morning such as toast, crackers, jello, [employer] chicken noodle soup. Use the zofran for nausea or vomiting. Take imodium OTC for diarrhea. Avoid milk products until the diarrhea is gone. ° °

## 2020-01-23 NOTE — ED Notes (Signed)
Pt experiencing emesis and diarrhea at this time.

## 2020-04-15 DIAGNOSIS — U071 COVID-19: Secondary | ICD-10-CM

## 2020-04-15 HISTORY — DX: COVID-19: U07.1

## 2020-04-20 ENCOUNTER — Other Ambulatory Visit: Payer: Self-pay

## 2020-04-20 ENCOUNTER — Ambulatory Visit: Payer: Commercial Managed Care - PPO

## 2020-04-20 DIAGNOSIS — Z20822 Contact with and (suspected) exposure to covid-19: Secondary | ICD-10-CM

## 2020-04-21 LAB — SARS-COV-2, NAA 2 DAY TAT

## 2020-04-21 LAB — NOVEL CORONAVIRUS, NAA: SARS-CoV-2, NAA: DETECTED — AB

## 2020-05-01 ENCOUNTER — Other Ambulatory Visit: Payer: Self-pay

## 2020-05-01 ENCOUNTER — Encounter: Payer: Self-pay | Admitting: Family Medicine

## 2020-05-01 ENCOUNTER — Ambulatory Visit (INDEPENDENT_AMBULATORY_CARE_PROVIDER_SITE_OTHER): Payer: Commercial Managed Care - PPO | Admitting: Family Medicine

## 2020-05-01 VITALS — BP 121/73 | HR 67 | Temp 96.6°F

## 2020-05-01 DIAGNOSIS — J988 Other specified respiratory disorders: Secondary | ICD-10-CM

## 2020-05-01 MED ORDER — AMOXICILLIN-POT CLAVULANATE 875-125 MG PO TABS: 1.0000 | ORAL_TABLET | Freq: Two times a day (BID) | ORAL | 0 refills | Status: AC

## 2020-05-01 MED ORDER — PREDNISONE 10 MG (21) PO TBPK: ORAL_TABLET | ORAL | 0 refills | Status: DC

## 2020-05-01 NOTE — Progress Notes (Signed)
Assessment & Plan:  1. Respiratory infection - Treated with antibiotics due to h/o frequent sinus infections. Discussed if this is still COVID-19, the antibiotics are not going to help. Discussed symptom management.  - amoxicillin-clavulanate (AUGMENTIN) 875-125 MG tablet; Take 1 tablet by mouth 2 (two) times daily for 7 days.  Dispense: 14 tablet; Refill: 0 - predniSONE (STERAPRED UNI-PAK 21 TAB) 10 MG (21) TBPK tablet; As directed x 6 days  Dispense: 21 tablet; Refill: 0   Follow up plan: Return if symptoms worsen or fail to improve.  [name], MSN, APRN, FNP-C [facility]  Subjective:   Patient ID: Lang Christian, male    DOB: 18-Aug-1991, 29 y.o.   MRN: [id number]  HPI: Christian Lang is a 29 y.o. male presenting on 05/01/2020 for face pressure (Patient tested pos for COVID and states that he is having new symptoms.), Cough, and Jaw Pain  Patient tested positive for COVID-19 on 04/20/2020 along with several other coworkers at the [employer]. He is concerned about feeling dizzy and having right sided jaw pain. He states he has had multiple sinus infections this year and really feels this has turned into one due to his symptoms. He also feels like he can feel "flowing" across his nose, just beneath his eyes. He denies any chest pain or shortness of breath (unless he is wearing a mask). He reports he has been monitoring his heart rate and it is higher than normal. He states his normal resting HR is in the 40s and lately it has been in the 80s.    ROS: Negative unless specifically indicated above in HPI.   Relevant past medical history reviewed and updated as indicated.   Allergies and medications reviewed and updated.   Current Outpatient Medications:  .  cetirizine (ZYRTEC) 10 MG tablet, Take 1 tablet (10 mg total) by mouth daily. (Patient not taking: Reported on 11/12/2018), Disp: 30 tablet, Rfl: 11 .  fexofenadine (ALLEGRA) 60 MG tablet, Take  60 mg by mouth daily., Disp: , Rfl:  .  fluticasone (FLONASE) 50 MCG/ACT nasal spray, Place 2 sprays into both nostrils daily. (Patient not taking: Reported on 11/12/2018), Disp: 16 g, Rfl: 6 .  MULTIPLE VITAMIN PO, Take 1 tablet by mouth daily., Disp: , Rfl:  .  ondansetron (ZOFRAN) 4 MG tablet, Take 1 tablet (4 mg total) by mouth every 8 (eight) hours as needed for nausea or vomiting., Disp: 12 tablet, Rfl: 0  Allergies  Allergen Reactions  . Hydrocodone Hives    Objective:   BP 121/73   Pulse 67   Temp (!) 96.6 F (35.9 C) (Temporal)   SpO2 99%    Physical Exam Vitals reviewed.  Constitutional:      General: He is not in acute distress.    Appearance: Normal appearance. He is not ill-appearing, toxic-appearing or diaphoretic.  HENT:     Head: Normocephalic and atraumatic.     Right Ear: Tympanic membrane, ear canal and external ear normal. There is no impacted cerumen.     Left Ear: Tympanic membrane, ear canal and external ear normal. There is no impacted cerumen.     Nose:     Right Sinus: Maxillary sinus tenderness and frontal sinus tenderness present.     Left Sinus: Maxillary sinus tenderness and frontal sinus tenderness present.     Mouth/Throat:     Mouth: Mucous membranes are moist.     Pharynx: Oropharynx is clear. No oropharyngeal exudate or posterior  oropharyngeal erythema.  Eyes:     General: No scleral icterus.       Right eye: No discharge.        Left eye: No discharge.     Conjunctiva/sclera: Conjunctivae normal.  Cardiovascular:     Rate and Rhythm: Normal rate and regular rhythm.     Heart sounds: Normal heart sounds. No murmur heard.  No friction rub. No gallop.   Pulmonary:     Effort: Pulmonary effort is normal. No respiratory distress.     Breath sounds: Normal breath sounds. No stridor. No wheezing, rhonchi or rales.  Musculoskeletal:        General: Normal range of motion.     Cervical back: Normal range of motion.  Lymphadenopathy:      Cervical: No cervical adenopathy.  Skin:    General: Skin is warm and dry.  Neurological:     Mental Status: He is alert and oriented to person, place, and time. Mental status is at baseline.  Psychiatric:        Mood and Affect: Mood normal.        Behavior: Behavior normal.        Thought Content: Thought content normal.        Judgment: Judgment normal.

## 2020-05-07 ENCOUNTER — Encounter: Payer: Self-pay | Admitting: Family Medicine

## 2021-06-05 ENCOUNTER — Ambulatory Visit
Admission: EM | Admit: 2021-06-05 | Discharge: 2021-06-05 | Disposition: A | Discharge status: Home / Self Care | Payer: Commercial Managed Care - PPO | Attending: Physician Assistant | Admitting: Physician Assistant

## 2021-06-05 ENCOUNTER — Other Ambulatory Visit: Payer: Self-pay

## 2021-06-05 ENCOUNTER — Encounter: Payer: Self-pay | Admitting: Emergency Medicine

## 2021-06-05 DIAGNOSIS — J069 Acute upper respiratory infection, unspecified: Secondary | ICD-10-CM | POA: Diagnosis not present

## 2021-06-05 MED ORDER — BENZONATATE 100 MG PO CAPS: 100.0000 mg | ORAL_CAPSULE | Freq: Four times a day (QID) | ORAL | 0 refills | Status: DC | PRN

## 2021-06-05 NOTE — Discharge Instructions (Addendum)
Return if any problems.

## 2021-06-05 NOTE — ED Triage Notes (Signed)
Pt reports cough and congestion for 4 days. His son tested positive for RSV . Pt reports he had a neg Home COVID test .

## 2021-06-11 NOTE — ED Provider Notes (Signed)
[facility]    CSN: [id number] Arrival date & time: 06/05/21  1012      History   Chief Complaint No chief complaint on file.   HPI Christian Lang is a 30 y.o. male.   The history is provided by the patient. No language interpreter was used.  Cough Cough characteristics:  Non-productive Sputum characteristics:  Nondescript Severity:  Moderate Onset quality:  Gradual Duration:  4 days Timing:  Constant Progression:  Worsening Chronicity:  New Context: sick contacts   Relieved by:  Nothing Worsened by:  Nothing Pt's son tested positive for rsv.  Pt complains of coughing Past Medical History:  Diagnosis Date   COVID-19 04/2020    There are no problems to display for this patient.   Past Surgical History:  Procedure Laterality Date   SHOULDER ARTHROSCOPY Left        Home Medications    Prior to Admission medications   Medication Sig Start Date End Date Taking? Authorizing Provider  benzonatate (TESSALON PERLES) 100 MG capsule Take 1 capsule (100 mg total) by mouth every 6 (six) hours as needed for cough. 06/05/21 06/05/22 Yes Elson Areas, PA-C  predniSONE (STERAPRED UNI-PAK 21 TAB) 10 MG (21) TBPK tablet As directed x 6 days 05/01/20   Gwenlyn Fudge, FNP    Family History History reviewed. No pertinent family history.  Social History Social History   Tobacco Use   Smoking status: Never   Smokeless tobacco: Never  Vaping Use   Vaping Use: Never used  Substance Use Topics   Alcohol use: No   Drug use: No     Allergies   Hydrocodone   Review of Systems Review of Systems  Respiratory:  Positive for cough.   All other systems reviewed and are negative.   Physical Exam Triage Vital Signs ED Triage Vitals  Enc Vitals Group     BP 06/05/21 1134 136/86     Pulse Rate 06/05/21 1134 85     Resp 06/05/21 1134 18     Temp 06/05/21 1134 98.5 F (36.9 C)     Temp src --      SpO2 06/05/21 1134 96 %     Weight --       Height --      Head Circumference --      Peak Flow --      Pain Score 06/05/21 1135 0     Pain Loc --      Pain Edu? --      Excl. in GC? --    No data found.  Updated Vital Signs BP 136/86   Pulse 85   Temp 98.5 F (36.9 C)   Resp 18   SpO2 96%   Visual Acuity Right Eye Distance:   Left Eye Distance:   Bilateral Distance:    Right Eye Near:   Left Eye Near:    Bilateral Near:     Physical Exam Vitals and nursing note reviewed.  Constitutional:      Appearance: He is well-developed.  HENT:     Head: Normocephalic and atraumatic.  Eyes:     Conjunctiva/sclera: Conjunctivae normal.  Cardiovascular:     Rate and Rhythm: Normal rate and regular rhythm.     Heart sounds: No murmur heard. Pulmonary:     Effort: Pulmonary effort is normal. No respiratory distress.     Breath sounds: Normal breath sounds.  Abdominal:     Palpations: Abdomen is soft.  Tenderness: There is no abdominal tenderness.  Musculoskeletal:     Cervical back: Neck supple.  Skin:    General: Skin is warm and dry.  Neurological:     Mental Status: He is alert.     UC Treatments / Results  Labs (all labs ordered are listed, but only abnormal results are displayed) Labs Reviewed - No data to display  EKG   Radiology No results found.  Procedures Procedures (including critical care time)  Medications Ordered in UC Medications - No data to display  Initial Impression / Assessment and Plan / UC Course  I have reviewed the triage vital signs and the nursing notes.  Pertinent labs & imaging results that were available during my care of the patient were reviewed by me and considered in my medical decision making (see chart for details).     MDM:  Pt counseled on viral illness.  Pt given rx for tessalon Final Clinical Impressions(s) / UC Diagnoses   Final diagnoses:  Viral URI     Discharge Instructions      Return if any problems.   ED Prescriptions     Medication  Sig Dispense Auth. Provider   benzonatate (TESSALON PERLES) 100 MG capsule Take 1 capsule (100 mg total) by mouth every 6 (six) hours as needed for cough. 30 capsule Elson Areas, [place]      PDMP not reviewed this encounter. An After Visit Summary was printed and given to the patient.    Elson Areas, [place] 06/11/21 1352

## 2021-07-19 ENCOUNTER — Encounter: Payer: Self-pay | Admitting: Nurse Practitioner

## 2021-07-19 ENCOUNTER — Ambulatory Visit (INDEPENDENT_AMBULATORY_CARE_PROVIDER_SITE_OTHER): Payer: Commercial Managed Care - PPO | Admitting: Nurse Practitioner

## 2021-07-19 ENCOUNTER — Other Ambulatory Visit: Payer: Self-pay

## 2021-07-19 VITALS — BP 137/79 | HR 82 | Temp 97.6°F | Ht 72.0 in | Wt 170.4 lb

## 2021-07-19 DIAGNOSIS — R079 Chest pain, unspecified: Secondary | ICD-10-CM | POA: Insufficient documentation

## 2021-07-19 LAB — BASIC METABOLIC PANEL
BUN/Creatinine Ratio: 14 (ref 9–20)
BUN: 17 mg/dL (ref 6–20)
CO2: 28 mmol/L (ref 20–29)
Calcium: 9.7 mg/dL (ref 8.7–10.2)
Chloride: 99 mmol/L (ref 96–106)
Creatinine, Ser: 1.23 mg/dL (ref 0.76–1.27)
Glucose: 82 mg/dL (ref 70–99)
Potassium: 4.1 mmol/L (ref 3.5–5.2)
Sodium: 138 mmol/L (ref 134–144)
eGFR: 80 mL/min/{1.73_m2} (ref >59)

## 2021-07-19 LAB — CBC WITH DIFFERENTIAL/PLATELET
Basophils Absolute: 0.1 10*3/uL (ref 0.0–0.2)
Basos: 1 %
EOS (ABSOLUTE): 0.1 10*3/uL (ref 0.0–0.4)
Eos: 1 %
Hematocrit: 44.8 % (ref 37.5–51.0)
Hemoglobin: 15.1 g/dL (ref 13.0–17.7)
Immature Grans (Abs): 0 10*3/uL (ref 0.0–0.1)
Immature Granulocytes: 0 %
Lymphocytes Absolute: 2.2 10*3/uL (ref 0.7–3.1)
Lymphs: 25 %
MCH: 29.1 pg (ref 26.6–33.0)
MCHC: 33.7 g/dL (ref 31.5–35.7)
MCV: 86 fL (ref 79–97)
Monocytes Absolute: 0.9 10*3/uL (ref 0.1–0.9)
Monocytes: 10 %
Neutrophils Absolute: 5.7 10*3/uL (ref 1.4–7.0)
Neutrophils: 63 %
Platelets: 212 10*3/uL (ref 150–450)
RBC: 5.19 x10E6/uL (ref 4.14–5.80)
RDW: 12.3 % (ref 11.6–15.4)
WBC: 9 10*3/uL (ref 3.4–10.8)

## 2021-07-19 MED ORDER — NITROGLYCERIN 0.4 MG SL SUBL: 0.4000 mg | SUBLINGUAL_TABLET | SUBLINGUAL | 3 refills | Status: DC | PRN

## 2021-07-19 NOTE — Progress Notes (Signed)
New Patient Note  RE: Christian Lang MRN: [id number] DOB: 22-Aug-1991 Date of Office Visit: 07/19/2021  Chief Complaint: New Patient (Initial Visit) and Chest Pain  History of Present Illness: Chest Pain: Patient complains of chest pain. Onset was 7 days ago, with resolved course since that time. The patient describes the pain as constant, dull in nature, radiates to the left arm, right arm, and legs  and face. Patient rates pain as a 5/10 in intensity.  Associated symptoms are chest pain, irregular heart beat, and Tingling in arms, face . Aggravating factors are pre work out caffeine supplement .  Alleviating factors are: none. Patient's cardiac risk factors are male gender.  Patient's risk factors for DVT/PE: none. Previous cardiac testing: none.   Assessment and Plan: Christian Lang is a 30 y.o. male with: Chest pain -provided extensive education to patient on the need to discontinue Ghost powder pre work out drink as it contains loads of caffeine and Niacin. Patient verbalize understanding.   - Nitroglycerin as needed. -patient knows to call 911 in an emergency for MI -hydration -EKG completed -follow up with unresolved symptoms.  No follow-ups on file.   Diagnostics:   Past Medical History: Patient Active Problem List   Diagnosis Date Noted   Chest pain 07/19/2021    Past Medical History:  Diagnosis Date   COVID-19 04/2020   Past Surgical History: Past Surgical History:  Procedure Laterality Date   SHOULDER ARTHROSCOPY Left    Medication List:  Current Outpatient Medications  Medication Sig Dispense Refill   nitroGLYCERIN (NITROSTAT) 0.4 MG SL tablet Place 1 tablet (0.4 mg total) under the tongue every 5 (five) minutes as needed for chest pain. 50 tablet 3   albuterol (VENTOLIN HFA) 108 (90 Base) MCG/ACT inhaler SMARTSIG:2 Puff(s) By Mouth 4 Times Daily (Patient not taking: Reported on 07/19/2021)     No current facility-administered medications for this visit.    Allergies: Allergies  Allergen Reactions   Hydrocodone Hives   Social History: Social History   Socioeconomic History   Marital status: Single    Spouse name: Not on file   Number of children: Not on file   Years of education: Not on file   Highest education level: Not on file  Occupational History   Not on file  Tobacco Use   Smoking status: Never   Smokeless tobacco: Never  Vaping Use   Vaping Use: Never used  Substance and Sexual Activity   Alcohol use: No   Drug use: No   Sexual activity: Not on file  Other Topics Concern   Not on file  Social History Narrative   Not on file   Social Determinants of Health   Financial Resource Strain: Not on file  Food Insecurity: Not on file  Transportation Needs: Not on file  Physical Activity: Not on file  Stress: Not on file  Social Connections: Not on file       Family History: History reviewed. No pertinent family history.       Review of Systems  Constitutional: Negative.   HENT: Negative.    Respiratory: Negative.    Cardiovascular:  Positive for chest pain.  Gastrointestinal: Negative.   Skin: Negative.  Negative for rash.  Neurological:  Negative for light-headedness and headaches.  All other systems reviewed and are negative. Objective: BP 137/79   Pulse 82   Temp 97.6 F (36.4 C)   Ht 6' (1.829 m)   Wt 170 lb 6.4 oz (77.3 kg)  SpO2 98%   BMI 23.11 kg/m  Body mass index is 23.11 kg/m. Physical Exam Vitals and nursing note reviewed.  Constitutional:      Appearance: Normal appearance.  HENT:     Head: Normocephalic.     Right Ear: Ear canal and external ear normal.     Left Ear: Ear canal and external ear normal.     Nose: Nose normal.  Eyes:     Conjunctiva/sclera: Conjunctivae normal.  Cardiovascular:     Rate and Rhythm: Normal rate and regular rhythm.     Pulses: Normal pulses.     Heart sounds: Normal heart sounds.  Pulmonary:     Effort: Pulmonary effort is normal.      Breath sounds: Normal breath sounds.  Abdominal:     General: Bowel sounds are normal.  Musculoskeletal:     Cervical back: Normal range of motion.     Right lower leg: No edema.     Left lower leg: No edema.  Skin:    General: Skin is warm.     Findings: No rash.  Psychiatric:        Behavior: Behavior normal.   The plan was reviewed with the patient/family, and all questions/concerned were addressed.  It was my pleasure to see Christian Lang today and participate in his care. Please feel free to contact me with any questions or concerns.  Sincerely,  Lynnell Chad NP [facility]

## 2021-07-19 NOTE — Assessment & Plan Note (Signed)
-  provided extensive education to patient on the need to discontinue Ghost powder pre work out drink as it contains loads of caffeine and Niacin. Patient verbalize understanding.   - Nitroglycerin as needed. -patient knows to call 911 in an emergency for MI -hydration -EKG completed -follow up with unresolved symptoms.

## 2021-07-19 NOTE — Patient Instructions (Signed)
Chest Wall Pain °Chest wall pain is pain in or around the bones and muscles of your chest. Chest wall pain may be caused by: °An injury. °Coughing a lot. °Using your chest and arm muscles too much. °Sometimes, the cause may not be known. This pain may take a few weeks or longer to get better. °Follow these instructions at home: °Managing pain, stiffness, and swelling °If told, put ice on the painful area: °Put ice in a plastic bag. °Place a towel between your skin and the bag. °Leave the ice on for 20 minutes, 2-3 times a day. ° °Activity °Rest as told by your doctor. °Avoid doing things that cause pain. This includes lifting heavy items. °Ask your doctor what activities are safe for you. °General instructions ° °Take over-the-counter and prescription medicines only as told by your doctor. °Do not use any products that contain nicotine or tobacco, such as cigarettes, e-cigarettes, and chewing tobacco. If you need help quitting, ask your doctor. °Keep all follow-up visits as told by your doctor. This is important. °Contact a doctor if: °You have a fever. °Your chest pain gets worse. °You have new symptoms. °Get help right away if: °You feel sick to your stomach (nauseous) or you throw up (vomit). °You feel sweaty or light-headed. °You have a cough with mucus from your lungs (sputum) or you cough up blood. °You are short of breath. °These symptoms may be an emergency. Do not wait to see if the symptoms will go away. Get medical help right away. Call your local emergency services (911 in the [place] Do not drive yourself to the hospital. °Summary °Chest wall pain is pain in or around the bones and muscles of your chest. °It may be treated with ice, rest, and medicines. Your condition may also get better if you avoid doing things that cause pain. °Contact a doctor if you have a fever, chest pain that gets worse, or new symptoms. °Get help right away if you feel light-headed or you get short of breath. These symptoms may  be an emergency. °This information is not intended to replace advice given to you by your health care provider. Make sure you discuss any questions you have with your health care provider. °Document Revised: 12/04/2020 Document Reviewed: 11/16/2020 °Elsevier Patient Education © 2022 [employer] ° °

## 2021-07-26 ENCOUNTER — Encounter: Payer: Self-pay | Admitting: Family Medicine

## 2021-07-26 ENCOUNTER — Ambulatory Visit (INDEPENDENT_AMBULATORY_CARE_PROVIDER_SITE_OTHER): Payer: Commercial Managed Care - PPO | Admitting: Family Medicine

## 2021-07-26 DIAGNOSIS — M62838 Other muscle spasm: Secondary | ICD-10-CM | POA: Diagnosis not present

## 2021-07-26 DIAGNOSIS — M545 Low back pain, unspecified: Secondary | ICD-10-CM

## 2021-07-26 MED ORDER — CYCLOBENZAPRINE HCL 5 MG PO TABS: 5.0000 mg | ORAL_TABLET | Freq: Three times a day (TID) | ORAL | 0 refills | Status: DC | PRN

## 2021-07-26 MED ORDER — PREDNISONE 20 MG PO TABS: 40.0000 mg | ORAL_TABLET | Freq: Every day | ORAL | 0 refills | Status: AC

## 2021-07-26 NOTE — Progress Notes (Signed)
Virtual Visit via telephone Note Due to COVID-19 pandemic this visit was conducted virtually. This visit type was conducted due to national recommendations for restrictions regarding the COVID-19 Pandemic (e.g. social distancing, sheltering in place) in an effort to limit this patient's exposure and mitigate transmission in our community. All issues noted in this document were discussed and addressed.  A physical exam was not performed with this format.   I connected with Christian Lang on 07/26/2021 at 1335 by telephone and verified that I am speaking with the correct person using two identifiers. Christian Lang is currently located at home and family is currently with them during visit. The provider, Kari Baars, FNP is located in their office at time of visit.  I discussed the limitations, risks, security and privacy concerns of performing an evaluation and management service by telephone and the availability of in person appointments. I also discussed with the patient that there may be a patient responsible charge related to this service. The patient expressed understanding and agreed to proceed.  Subjective:  Patient ID: Christian Lang, male    DOB: 1990/09/25, 30 y.o.   MRN: [id number]  Chief Complaint:  Back Pain   HPI: Christian Lang is a 30 y.o. male presenting on 07/26/2021 for Back Pain   Patient reports he woke up this morning and took a shower, states when he stepped out of the shower he developed significant muscle spasm in his lower back.  States the pain did not radiate anywhere, no loss of function or weakness, no loss of bowel or bladder function, no saddle anesthesia, or known injury.  He has taken Tylenol without relief of pain.  Back Pain This is a new problem. The problem occurs constantly. The problem is unchanged. The pain is present in the lumbar spine. The quality of the pain is described as aching, cramping and stabbing. The pain does not radiate.  The pain is at a severity of 7/10. The pain is moderate. The symptoms are aggravated by position, bending, standing and twisting. Pertinent negatives include no abdominal pain, bladder incontinence, bowel incontinence, chest pain, dysuria, fever, headaches, numbness, paresis, paresthesias, pelvic pain, perianal numbness, tingling or weakness.    Relevant past medical, surgical, family, and social history reviewed and updated as indicated.  Allergies and medications reviewed and updated.   Past Medical History:  Diagnosis Date   COVID-19 04/2020    Past Surgical History:  Procedure Laterality Date   SHOULDER ARTHROSCOPY Left     Social History   Socioeconomic History   Marital status: Single    Spouse name: Not on file   Number of children: Not on file   Years of education: Not on file   Highest education level: Not on file  Occupational History   Not on file  Tobacco Use   Smoking status: Never   Smokeless tobacco: Never  Vaping Use   Vaping Use: Never used  Substance and Sexual Activity   Alcohol use: No   Drug use: No   Sexual activity: Not on file  Other Topics Concern   Not on file  Social History Narrative   Not on file   Social Determinants of Health   Financial Resource Strain: Not on file  Food Insecurity: Not on file  Transportation Needs: Not on file  Physical Activity: Not on file  Stress: Not on file  Social Connections: Not on file  Intimate Partner Violence: Not on file    Outpatient Encounter Medications  as of 07/26/2021  Medication Sig   cyclobenzaprine (FLEXERIL) 5 MG tablet Take 1 tablet (5 mg total) by mouth 3 (three) times daily as needed for muscle spasms.   predniSONE (DELTASONE) 20 MG tablet Take 2 tablets (40 mg total) by mouth daily with breakfast for 5 days. 2 po at sametime daily for 5 days- start tomorrow   albuterol (VENTOLIN HFA) 108 (90 Base) MCG/ACT inhaler SMARTSIG:2 Puff(s) By Mouth 4 Times Daily (Patient not taking: Reported  on 07/19/2021)   nitroGLYCERIN (NITROSTAT) 0.4 MG SL tablet Place 1 tablet (0.4 mg total) under the tongue every 5 (five) minutes as needed for chest pain.   No facility-administered encounter medications on file as of 07/26/2021.    Allergies  Allergen Reactions   Hydrocodone Hives    Review of Systems  Constitutional:  Negative for activity change, appetite change, chills, diaphoresis, fatigue, fever and unexpected weight change.  HENT: Negative.    Eyes: Negative.   Respiratory:  Negative for cough, chest tightness and shortness of breath.   Cardiovascular:  Negative for chest pain, palpitations and leg swelling.  Gastrointestinal:  Negative for abdominal pain, blood in stool, bowel incontinence, constipation, diarrhea, nausea and vomiting.  Endocrine: Negative.   Genitourinary:  Negative for bladder incontinence, decreased urine volume, difficulty urinating, dysuria, frequency, pelvic pain and urgency.  Musculoskeletal:  Positive for back pain and myalgias. Negative for arthralgias, gait problem, joint swelling, neck pain and neck stiffness.  Skin: Negative.   Allergic/Immunologic: Negative.   Neurological:  Negative for dizziness, tingling, weakness, numbness, headaches and paresthesias.  Hematological: Negative.   Psychiatric/Behavioral:  Negative for confusion, hallucinations, sleep disturbance and suicidal ideas.   All other systems reviewed and are negative.       Observations/Objective: No vital signs or physical exam, this was a telephone or virtual health encounter.  Pt alert and oriented, answers all questions appropriately, and able to speak in full sentences.    Assessment and Plan: Christian Lang was seen today for back pain.  Diagnoses and all orders for this visit:  Acute bilateral low back pain without sciatica Muscle spasm Low back pain with muscle stiffness and tightness.  No red flags concerning for cauda equina syndrome.  Will burst with steroids and provide  Flexeril as needed for muscle spasms.  Symptomatic care discussed in detail.  Patient aware to report any new, worsening, or persistent symptoms.  Follow-up in 4 to 6 weeks for reevaluation. -     cyclobenzaprine (FLEXERIL) 5 MG tablet; Take 1 tablet (5 mg total) by mouth 3 (three) times daily as needed for muscle spasms. -     predniSONE (DELTASONE) 20 MG tablet; Take 2 tablets (40 mg total) by mouth daily with breakfast for 5 days. 2 po at sametime daily for 5 days- start tomorrow     Follow Up Instructions: Return in about 6 weeks (around 09/06/2021), or if symptoms worsen or fail to improve.    I discussed the assessment and treatment plan with the patient. The patient was provided an opportunity to ask questions and all were answered. The patient agreed with the plan and demonstrated an understanding of the instructions.   The patient was advised to call back or seek an in-person evaluation if the symptoms worsen or if the condition fails to improve as anticipated.  The above assessment and management plan was discussed with the patient. The patient verbalized understanding of and has agreed to the management plan. Patient is aware to call the clinic if  they develop any new symptoms or if symptoms persist or worsen. Patient is aware when to return to the clinic for a follow-up visit. Patient educated on when it is appropriate to go to the emergency department.    I provided 15 minutes of non-face-to-face time during this encounter. The call started at 1335. The call ended at 1350. The other time was used for coordination of care.    Kari Baars, FNP-C [facility] [address] [place], [place] [address] [phone] 07/26/2021

## 2021-08-12 ENCOUNTER — Encounter: Payer: Self-pay | Admitting: Nurse Practitioner

## 2021-08-14 ENCOUNTER — Other Ambulatory Visit: Payer: Self-pay | Admitting: Nurse Practitioner

## 2021-08-14 DIAGNOSIS — R079 Chest pain, unspecified: Secondary | ICD-10-CM

## 2021-08-15 NOTE — Progress Notes (Signed)
Cardiology Office Note:   Date:  08/16/2021  NAME:  Christian Lang    MRN: [id number] DOB:  12-Sep-1991   PCP:  Daryll Drown, NP  Cardiologist:  None  Electrophysiologist:  None   Referring MD: Daryll Drown, NP   Chief Complaint  Patient presents with   Chest Pain   History of Present Illness:   Christian Lang is a 30 y.o. male with a hx of covid 19 infection who is being seen today for the evaluation of chest pain at the request of Daryll Drown, NP.  He reports he developed chest pain and throbbing sensation in his left chest on 07/19/2021.  He was evaluated by his primary care physician that day.  EKG there was normal and he was referred to [place] for further evaluation.  He reports he is continued to have symptoms but not as bad.  He apparently was taking a preworkout supplement that contained 250 mg of caffeine.  He has backed off this.  Symptoms have somewhat improved.  He describes a muscle spasm sensation in his left chest and under his left arm.  He does describe a new shoulder right workout 1 week before.  He reports this was an overhead press while sitting on the floor.  I suspect this could be contributing.  He really has no other limitations.  His symptoms are not brought on by activity.  They are not alleviated by rest.  He does report stress may bring on some of his symptoms.  He works as a Emergency planning/management officer.  He also has a 70-year-old son.  He reports that some of work could be stressful.  He really has no medical problems.  Blood pressure is normal.  Body weight is normal.  There is no strong family history of heart disease.  He has never had a heart attack or stroke.  He does not use drugs or alcohol.  He does not use tobacco products.  He has normal pulses on exam.  I do not believe this is a cardiovascular issue.  There is no palpable pain on exam today either.  EKG in office demonstrates normal sinus rhythm heart rate 62 with isolated T wave inversion in lead V3.  This  is a normal finding in a young adult.  He does have sinus arrhythmia as well.  Past Medical History: Past Medical History:  Diagnosis Date   COVID-19 04/2020    Past Surgical History: Past Surgical History:  Procedure Laterality Date   SHOULDER ARTHROSCOPY Left     Current Medications: Current Meds  Medication Sig   albuterol (VENTOLIN HFA) 108 (90 Base) MCG/ACT inhaler 2 puffs every 6 (six) hours as needed for wheezing or shortness of breath.   cyclobenzaprine (FLEXERIL) 5 MG tablet Take 1 tablet (5 mg total) by mouth 3 (three) times daily as needed for muscle spasms.   ipratropium (ATROVENT) 0.03 % nasal spray Place 2 sprays into both nostrils as needed for rhinitis.   nitroGLYCERIN (NITROSTAT) 0.4 MG SL tablet Place 1 tablet (0.4 mg total) under the tongue every 5 (five) minutes as needed for chest pain.     Allergies:    Hydrocodone   Social History: Social History   Socioeconomic History   Marital status: Married    Spouse name: Not on file   Number of children: 1   Years of education: Not on file   Highest education level: Not on file  Occupational History   Occupation: Emergency planning/management officer  Tobacco Use   Smoking status: Never   Smokeless tobacco: Never  Vaping Use   Vaping Use: Never used  Substance and Sexual Activity   Alcohol use: No   Drug use: No   Sexual activity: Not on file  Other Topics Concern   Not on file  Social History Narrative   Not on file   Social Determinants of Health   Financial Resource Strain: Not on file  Food Insecurity: Not on file  Transportation Needs: Not on file  Physical Activity: Not on file  Stress: Not on file  Social Connections: Not on file     Family History: The patient's family history includes Heart attack in his paternal grandfather.  ROS:   All other ROS reviewed and negative. Pertinent positives noted in the HPI.     EKGs/Labs/Other Studies Reviewed:   The following studies were personally reviewed by me  today:  EKG:  EKG is ordered today.  The ekg ordered today demonstrates normal sinus rhythm heart rate 62, sinus arrhythmia, isolated T wave inversion in lead V3 which is a normal finding in a young healthy adult, and was personally reviewed by me.   Recent Labs: 07/19/2021: BUN 17; Creatinine, Ser 1.23; Hemoglobin 15.1; Platelets 212; Potassium 4.1; Sodium 138   Recent Lipid Panel No results found for: CHOL, TRIG, HDL, CHOLHDL, VLDL, LDLCALC, LDLDIRECT  Physical Exam:   VS:  BP 118/60   Pulse 81   Ht 6' (1.829 m)   Wt 169 lb 6.4 oz (76.8 kg)   SpO2 98%   BMI 22.97 kg/m    Wt Readings from Last 3 Encounters:  08/16/21 169 lb 6.4 oz (76.8 kg)  07/19/21 170 lb 6.4 oz (77.3 kg)  01/23/20 155 lb (70.3 kg)    General: Well nourished, well developed, in no acute distress Head: Atraumatic, normal size  Eyes: PEERLA, EOMI  Neck: Supple, no JVD Endocrine: No thryomegaly Cardiac: Normal S1, S2; RRR; no murmurs, rubs, or gallops Lungs: Clear to auscultation bilaterally, no wheezing, rhonchi or rales  Abd: Soft, nontender, no hepatomegaly  Ext: No edema, pulses 2+ Musculoskeletal: No deformities, BUE and BLE strength normal and equal Skin: Warm and dry, no rashes   Neuro: Alert and oriented to person, place, time, and situation, CNII-XII grossly intact, no focal deficits  Psych: Normal mood and affect   ASSESSMENT:   Christian Lang is a 30 y.o. male who presents for the following: 1. Precordial pain     PLAN:   1. Precordial pain -Evaluated by his primary care physician on 07/19/2021 with symptoms of chest discomfort as well as muscle spasms in the left chest and left arm.  This coincided with starting a new preworkout which was very high in caffeine up to 250 mg.  Symptoms have improved with stopping this but not gone away.  His EKG is normal.  I did review it from 07/19/2021.  This demonstrates normal sinus rhythm with sinus arrhythmia.  It also demonstrates an isolated T wave  inversion in lead V3.  I did inform him this is a normal finding in a young healthy individual.  This does not require further work-up.  His cardiovascular exam is normal.  He has 2+ pulses in the upper extremities bilaterally.  I do not believe his residual arm pain is vascular in nature.  I believe this likely is just muscle spasm or something musculoskeletal in nature.  He does describe starting a new overhead shoulder workout the week before.  This could simply just be musculoskeletal in nature.  I think he is okay to take ibuprofen as needed and drink plenty of water.  Given his symptoms that are noncardiac and normal EKG and normal cardiovascular exam I see no need for further cardiac testing.  He will see [place] back as needed.  Disposition: Return if symptoms worsen or fail to improve.  Medication Adjustments/Labs and Tests Ordered: Current medicines are reviewed at length with the patient today.  Concerns regarding medicines are outlined above.  No orders of the defined types were placed in this encounter.  No orders of the defined types were placed in this encounter.   There are no Patient Instructions on file for this visit.    Signed, Lenna Gilford. Flora Lipps, MD, [facility]  [facility]  [address] [place], [place] [address] [phone]  08/16/2021 2:16 PM

## 2021-08-16 ENCOUNTER — Encounter: Payer: Self-pay | Admitting: Cardiovascular Disease

## 2021-08-16 ENCOUNTER — Ambulatory Visit (INDEPENDENT_AMBULATORY_CARE_PROVIDER_SITE_OTHER): Payer: Commercial Managed Care - PPO | Admitting: Cardiovascular Disease

## 2021-08-16 VITALS — BP 118/60 | HR 81 | Ht 72.0 in | Wt 169.4 lb

## 2021-08-16 DIAGNOSIS — R072 Precordial pain: Secondary | ICD-10-CM | POA: Diagnosis not present

## 2021-08-16 NOTE — Patient Instructions (Signed)
Medication Instructions:   Your physician recommends that you continue on your current medications as directed. Please refer to the Current Medication list given to you today.   Labwork: None   Testing/Procedures: None     Follow-Up: As needed    Thank you for choosing [facility] !       

## 2021-10-24 ENCOUNTER — Emergency Department (HOSPITAL_COMMUNITY): Payer: No Typology Code available for payment source

## 2021-10-24 ENCOUNTER — Other Ambulatory Visit: Payer: Self-pay

## 2021-10-24 ENCOUNTER — Encounter (HOSPITAL_COMMUNITY): Payer: Self-pay | Admitting: Emergency Medicine

## 2021-10-24 ENCOUNTER — Emergency Department (HOSPITAL_COMMUNITY)
Admission: EM | Admit: 2021-10-24 | Discharge: 2021-10-24 | Disposition: A | Discharge status: Home / Self Care | Payer: No Typology Code available for payment source | Attending: Emergency Medicine | Admitting: Emergency Medicine

## 2021-10-24 DIAGNOSIS — S199XXA Unspecified injury of neck, initial encounter: Secondary | ICD-10-CM | POA: Insufficient documentation

## 2021-10-24 DIAGNOSIS — Z23 Encounter for immunization: Secondary | ICD-10-CM | POA: Diagnosis not present

## 2021-10-24 DIAGNOSIS — S0592XA Unspecified injury of left eye and orbit, initial encounter: Secondary | ICD-10-CM | POA: Diagnosis present

## 2021-10-24 DIAGNOSIS — S0990XA Unspecified injury of head, initial encounter: Secondary | ICD-10-CM | POA: Insufficient documentation

## 2021-10-24 DIAGNOSIS — Y92410 Unspecified street and highway as the place of occurrence of the external cause: Secondary | ICD-10-CM | POA: Insufficient documentation

## 2021-10-24 DIAGNOSIS — S01112A Laceration without foreign body of left eyelid and periocular area, initial encounter: Secondary | ICD-10-CM | POA: Insufficient documentation

## 2021-10-24 HISTORY — DX: Unspecified intracranial injury with loss of consciousness status unknown, initial encounter: S06.9XAA

## 2021-10-24 MED ORDER — IBUPROFEN 800 MG PO TABS
800.0000 mg | ORAL_TABLET | Freq: Once | ORAL | Status: AC
  Administered 2021-10-24: 800 mg via ORAL
  Filled 2021-10-24: qty 1

## 2021-10-24 MED ORDER — IBUPROFEN 800 MG PO TABS: 800.0000 mg | ORAL_TABLET | Freq: Three times a day (TID) | ORAL | 1 refills | Status: DC | PRN

## 2021-10-24 MED ORDER — TETANUS-DIPHTH-ACELL PERTUSSIS 5-2.5-18.5 LF-MCG/0.5 IM SUSY
0.5000 mL | PREFILLED_SYRINGE | Freq: Once | INTRAMUSCULAR | Status: AC
  Administered 2021-10-24: 0.5 mL via INTRAMUSCULAR
  Filled 2021-10-24: qty 0.5

## 2021-10-24 MED ORDER — OXYCODONE-ACETAMINOPHEN 5-325 MG PO TABS: 1.0000 | ORAL_TABLET | Freq: Once | ORAL | Status: DC

## 2021-10-24 MED ORDER — LIDOCAINE HCL (PF) 2 % IJ SOLN: 2.0000 mL | Freq: Once | INTRAMUSCULAR | Status: AC

## 2021-10-24 MED ORDER — LIDOCAINE-EPINEPHRINE-TETRACAINE (LET) TOPICAL GEL
3.0000 mL | Freq: Once | TOPICAL | Status: AC
  Administered 2021-10-24: 3 mL via TOPICAL
  Filled 2021-10-24: qty 3

## 2021-10-24 MED ORDER — LIDOCAINE HCL (PF) 2 % IJ SOLN
INTRAMUSCULAR | Status: AC
  Administered 2021-10-24: 2 mL
  Filled 2021-10-24: qty 10
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
## 2021-10-24 NOTE — ED Notes (Signed)
Suture cart at bedside 

## 2021-10-24 NOTE — Discharge Instructions (Addendum)
Follow-up with your family doctor either Tuesday or Wednesday to get the sutures out and for recheck, return sooner if any problems

## 2021-10-24 NOTE — ED Provider Notes (Signed)
Main provider Dr Estell Harpin. I completed suturing.  Pt also with new complaint of right knee pain after ambulation to bathroom - xrays added.  LACERATION REPAIR Performed by: Burgess Amor Authorized by: Burgess Amor Consent: Verbal consent obtained. Risks and benefits: risks, benefits and alternatives were discussed Consent given by: patient Patient identity confirmed: provided demographic data Prepped and Draped in normal sterile fashion Wound explored  Laceration Location: left eyebrow  Laceration Length: 2cm  No Foreign Bodies seen or palpated  Anesthesia: local infiltration  Local anesthetic: lidocaine 2% without epinephrine  Anesthetic total: 1 ml, after topical LET application  Irrigation method: syringe Amount of cleaning: standard  Skin closure: ethilon 6-0  Number of sutures: 4  Technique: simple interrupted.  Patient tolerance: Patient tolerated the procedure well with no immediate complications.    Burgess Amor, PA-C 10/24/21 1139    Bethann Berkshire, MD 10/24/21 [phone]
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
## 2021-10-30 ENCOUNTER — Ambulatory Visit (INDEPENDENT_AMBULATORY_CARE_PROVIDER_SITE_OTHER): Payer: No Typology Code available for payment source | Admitting: Nurse Practitioner

## 2021-10-30 ENCOUNTER — Encounter: Payer: Self-pay | Admitting: Nurse Practitioner

## 2021-10-30 VITALS — BP 109/64 | HR 79 | Temp 98.8°F | Ht 72.0 in | Wt 168.0 lb

## 2021-10-30 DIAGNOSIS — Z4802 Encounter for removal of sutures: Secondary | ICD-10-CM | POA: Diagnosis not present

## 2021-10-30 DIAGNOSIS — T8189XA Other complications of procedures, not elsewhere classified, initial encounter: Secondary | ICD-10-CM | POA: Insufficient documentation
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
## 2021-11-08 NOTE — Assessment & Plan Note (Signed)
Patient presents for suture removal. All 4 sutures removed from left upper eye brow area., patient tolerated well, no s/s of infection noted. Slight tenderness present. Patient knows to follow up as needed.

## 2022-07-16 ENCOUNTER — Ambulatory Visit: Payer: Commercial Managed Care - PPO | Admitting: Physician Assistant

## 2022-08-10 ENCOUNTER — Telehealth: Payer: Commercial Managed Care - PPO | Admitting: Nurse Practitioner

## 2022-08-10 DIAGNOSIS — J40 Bronchitis, not specified as acute or chronic: Secondary | ICD-10-CM | POA: Diagnosis not present

## 2022-08-10 MED ORDER — PROMETHAZINE-DM 6.25-15 MG/5ML PO SYRP
5.0000 mL | ORAL_SOLUTION | Freq: Four times a day (QID) | ORAL | 0 refills | Status: DC | PRN
Start: 2022-08-10 — End: 2023-03-17

## 2022-08-10 MED ORDER — AZITHROMYCIN 250 MG PO TABS: ORAL_TABLET | ORAL | 0 refills | Status: AC

## 2022-08-10 MED ORDER — ALBUTEROL SULFATE HFA 108 (90 BASE) MCG/ACT IN AERS: 1.0000 | INHALATION_SPRAY | Freq: Four times a day (QID) | RESPIRATORY_TRACT | 0 refills | Status: DC | PRN

## 2022-08-10 NOTE — Patient Instructions (Signed)
Christian Lang, thank you for joining Claiborne Rigg, NP for today's virtual visit.  While this provider is not your primary care provider (PCP), if your PCP is located in our provider database this encounter information will be shared with them immediately following your visit.   A [facility] MyChart account gives you access to today's visit and all your visits, tests, and labs performed at [facility] " click here if you don't have a [facility] MyChart account or go to [email or web address]  Consent: (Patient) Christian Lang provided verbal consent for this virtual visit at the beginning of the encounter.  Current Medications:  Current Outpatient Medications:    azithromycin (ZITHROMAX) 250 MG tablet, Take 2 tablets on day 1, then 1 tablet daily on days 2 through 5, Disp: 6 tablet, Rfl: 0   promethazine-dextromethorphan (PROMETHAZINE-DM) 6.25-15 MG/5ML syrup, Take 5 mLs by mouth 4 (four) times daily as needed for cough., Disp: 240 mL, Rfl: 0   albuterol (VENTOLIN HFA) 108 (90 Base) MCG/ACT inhaler, Inhale 1-2 puffs into the lungs every 6 (six) hours as needed for wheezing or shortness of breath., Disp: 8 g, Rfl: 0   cyclobenzaprine (FLEXERIL) 5 MG tablet, Take 1 tablet (5 mg total) by mouth 3 (three) times daily as needed for muscle spasms., Disp: 30 tablet, Rfl: 0   ibuprofen (ADVIL) 800 MG tablet, Take 1 tablet (800 mg total) by mouth every 8 (eight) hours as needed for moderate pain., Disp: 20 tablet, Rfl: 1   ipratropium (ATROVENT) 0.03 % nasal spray, Place 2 sprays into both nostrils as needed for rhinitis., Disp: , Rfl:    nitroGLYCERIN (NITROSTAT) 0.4 MG SL tablet, Place 1 tablet (0.4 mg total) under the tongue every 5 (five) minutes as needed for chest pain., Disp: 50 tablet, Rfl: 3   Medications ordered in this encounter:  Meds ordered this encounter  Medications   azithromycin (ZITHROMAX) 250 MG tablet    Sig: Take 2 tablets on day 1, then 1 tablet daily  on days 2 through 5    Dispense:  6 tablet    Refill:  0    Order Specific Question:   Supervising Provider    Answer:   Merrilee Jansky [[id number]]   albuterol (VENTOLIN HFA) 108 (90 Base) MCG/ACT inhaler    Sig: Inhale 1-2 puffs into the lungs every 6 (six) hours as needed for wheezing or shortness of breath.    Dispense:  8 g    Refill:  0    Order Specific Question:   Supervising Provider    Answer:   Merrilee Jansky [id number]   promethazine-dextromethorphan (PROMETHAZINE-DM) 6.25-15 MG/5ML syrup    Sig: Take 5 mLs by mouth 4 (four) times daily as needed for cough.    Dispense:  240 mL    Refill:  0    Order Specific Question:   Supervising Provider    Answer:   Merrilee Jansky [[id number]]     *If you need refills on other medications prior to your next appointment, please contact your pharmacy*  Follow-Up: Call back or seek an in-person evaluation if the symptoms worsen or if the condition fails to improve as anticipated.  [facility] [phone]  Other Instructions INSTRUCTIONS: use a humidifier for nasal congestion Drink plenty of fluids, rest and wash hands frequently to avoid the spread of infection Alternate tylenol and Motrin for relief of fever    If you have been instructed to have an in-person  evaluation today at a local Urgent Care facility, please use the link below. It will take you to a list of all of our available [facility], including address, phone number and hours of operation. Please do not delay care.  [facility]  If you or a family member do not have a primary care provider, use the link below to schedule a visit and establish care. When you choose a [facility] primary care physician or advanced practice provider, you gain a long-term partner in health. Find a Primary Care Provider  Learn more about [facility]'s in-office and virtual care options: [facility]

## 2022-08-10 NOTE — Progress Notes (Signed)
Virtual Visit Consent   Christian Lang, you are scheduled for a virtual visit with a [facility] provider today. Just as with appointments in the office, your consent must be obtained to participate. Your consent will be active for this visit and any virtual visit you may have with one of our providers in the next 365 days. If you have a MyChart account, a copy of this consent can be sent to you electronically.  As this is a virtual visit, video technology does not allow for your provider to perform a traditional examination. This may limit your provider's ability to fully assess your condition. If your provider identifies any concerns that need to be evaluated in person or the need to arrange testing (such as labs, EKG, etc.), we will make arrangements to do so. Although advances in technology are sophisticated, we cannot ensure that it will always work on either your end or our end. If the connection with a video visit is poor, the visit may have to be switched to a telephone visit. With either a video or telephone visit, we are not always able to ensure that we have a secure connection.  By engaging in this virtual visit, you consent to the provision of healthcare and authorize for your insurance to be billed (if applicable) for the services provided during this visit. Depending on your insurance coverage, you may receive a charge related to this service.  I need to obtain your verbal consent now. Are you willing to proceed with your visit today? Christian Lang has provided verbal consent on 08/10/2022 for a virtual visit (video or telephone). Claiborne Rigg, NP  Date: 08/10/2022 2:54 PM  Virtual Visit via Video Note   I, Claiborne Rigg, connected with  Christian Lang  ([id number], May 09, 1991) on 08/10/22 at  2:45 PM EST by a video-enabled telemedicine application and verified that I am speaking with the correct person using two identifiers.  Location: Patient: Virtual Visit  Location Patient: Home Provider: Virtual Visit Location Provider: Home Office   I discussed the limitations of evaluation and management by telemedicine and the availability of in person appointments. The patient expressed understanding and agreed to proceed.    History of Present Illness: Christian Lang is a 31 y.o. who identifies as a male who was assigned male at birth, and is being seen today for bronchitis.   Patient presents for presents evaluation of  cough, chest congestion, headaches, post nasal drip . Symptoms began a few weeks ago and are unchanged since that time.  Past history is significant for no history of pneumonia or bronchitis. He has not taken a covid test.    Problems:  Patient Active Problem List   Diagnosis Date Noted   Encounter for removal of sutures 10/30/2021   Chest pain 07/19/2021    Allergies:  Allergies  Allergen Reactions   Hydrocodone Hives   Medications:  Current Outpatient Medications:    azithromycin (ZITHROMAX) 250 MG tablet, Take 2 tablets on day 1, then 1 tablet daily on days 2 through 5, Disp: 6 tablet, Rfl: 0   promethazine-dextromethorphan (PROMETHAZINE-DM) 6.25-15 MG/5ML syrup, Take 5 mLs by mouth 4 (four) times daily as needed for cough., Disp: 240 mL, Rfl: 0   albuterol (VENTOLIN HFA) 108 (90 Base) MCG/ACT inhaler, Inhale 1-2 puffs into the lungs every 6 (six) hours as needed for wheezing or shortness of breath., Disp: 8 g, Rfl: 0   cyclobenzaprine (FLEXERIL) 5 MG tablet, Take 1 tablet (  5 mg total) by mouth 3 (three) times daily as needed for muscle spasms., Disp: 30 tablet, Rfl: 0   ibuprofen (ADVIL) 800 MG tablet, Take 1 tablet (800 mg total) by mouth every 8 (eight) hours as needed for moderate pain., Disp: 20 tablet, Rfl: 1   ipratropium (ATROVENT) 0.03 % nasal spray, Place 2 sprays into both nostrils as needed for rhinitis., Disp: , Rfl:    nitroGLYCERIN (NITROSTAT) 0.4 MG SL tablet, Place 1 tablet (0.4 mg total) under the tongue  every 5 (five) minutes as needed for chest pain., Disp: 50 tablet, Rfl: 3  Observations/Objective: Patient is well-developed, well-nourished in no acute distress.  Resting comfortably  at home.  Head is normocephalic, atraumatic.  No labored breathing.  Speech is clear and coherent with logical content.  Patient is alert and oriented at baseline.    Assessment and Plan: 1. Bronchitis - azithromycin (ZITHROMAX) 250 MG tablet; Take 2 tablets on day 1, then 1 tablet daily on days 2 through 5  Dispense: 6 tablet; Refill: 0 - albuterol (VENTOLIN HFA) 108 (90 Base) MCG/ACT inhaler; Inhale 1-2 puffs into the lungs every 6 (six) hours as needed for wheezing or shortness of breath.  Dispense: 8 g; Refill: 0 - promethazine-dextromethorphan (PROMETHAZINE-DM) 6.25-15 MG/5ML syrup; Take 5 mLs by mouth 4 (four) times daily as needed for cough.  Dispense: 240 mL; Refill: 0  INSTRUCTIONS: use a humidifier for nasal congestion Drink plenty of fluids, rest and wash hands frequently to avoid the spread of infection Alternate tylenol and Motrin for relief of fever   Follow Up Instructions: I discussed the assessment and treatment plan with the patient. The patient was provided an opportunity to ask questions and all were answered. The patient agreed with the plan and demonstrated an understanding of the instructions.  A copy of instructions were sent to the patient via MyChart unless otherwise noted below.    The patient was advised to call back or seek an in-person evaluation if the symptoms worsen or if the condition fails to improve as anticipated.  Time:  I spent 11 minutes with the patient via telehealth technology discussing the above problems/concerns.    Claiborne Rigg, NP

## 2022-10-06 ENCOUNTER — Other Ambulatory Visit: Payer: Self-pay

## 2022-10-06 ENCOUNTER — Ambulatory Visit (INDEPENDENT_AMBULATORY_CARE_PROVIDER_SITE_OTHER): Payer: Commercial Managed Care - PPO | Admitting: Family Medicine

## 2022-10-06 ENCOUNTER — Encounter: Payer: Self-pay | Admitting: Family Medicine

## 2022-10-06 VITALS — BP 128/66 | HR 65 | Temp 97.7°F | Resp 20 | Ht 72.0 in | Wt 170.0 lb

## 2022-10-06 DIAGNOSIS — R1084 Generalized abdominal pain: Secondary | ICD-10-CM

## 2022-10-06 DIAGNOSIS — R197 Diarrhea, unspecified: Secondary | ICD-10-CM

## 2022-10-06 LAB — URINALYSIS, ROUTINE W REFLEX MICROSCOPIC
Bilirubin, UA: NEGATIVE
Glucose, UA: NEGATIVE
Leukocytes,UA: NEGATIVE
Nitrite, UA: NEGATIVE
RBC, UA: NEGATIVE
Specific Gravity, UA: 1.025 (ref 1.005–1.030)
Urobilinogen, Ur: 0.2 mg/dL (ref 0.2–1.0)
pH, UA: 6.5 (ref 5.0–7.5)

## 2022-10-06 LAB — MICROSCOPIC EXAMINATION
Bacteria, UA: NONE SEEN
Epithelial Cells (non renal): NONE SEEN /hpf (ref 0–10)
Renal Epithel, UA: NONE SEEN /hpf
WBC, UA: NONE SEEN /hpf (ref 0–5)

## 2022-10-06 NOTE — Progress Notes (Signed)
Acute Office Visit  Subjective:     Patient ID: Christian Lang, male    DOB: October 26, 1990, 32 y.o.   MRN: [id number]  Chief Complaint  Patient presents with   Abdominal Pain   Diarrhea    Abdominal Pain This is a new problem. Episode onset: 4 days ago. The onset quality is gradual. The problem occurs 2 to 4 times per day. The problem has been unchanged. The pain is located in the periumbilical region and epigastric region. The pain is moderate. The quality of the pain is aching, cramping and dull. The abdominal pain radiates to the LLQ and RLQ. Associated symptoms include diarrhea (intemrittent with regular stools), headaches, nausea (2 days ago, now resolved) and weight loss (7 lbs). Pertinent negatives include no anorexia, arthralgias, belching, dysuria, fever, flatus, frequency, hematochezia, hematuria, melena, myalgias or vomiting. Nothing aggravates the pain. The pain is relieved by Nothing. Treatments tried: heat, drinkng aloe, gas x, probiotic, tumeric. The treatment provided no relief. There is no history of abdominal surgery, colon cancer, Crohn's disease, gallstones, GERD, irritable bowel syndrome, pancreatitis, PUD or ulcerative colitis.   Exposure to a significant amount of illegal mushrooms for 2 days. This exposure these the day before and the day of the start of symptoms. He did not ingest these but did have to open a significant amount of jars to weigh.  Denies respiratory symptoms, confusion, or altered mental status. His son was sick with a stomach bug about 8 days prior to the start of his symptoms.   Review of Systems  Constitutional:  Positive for weight loss (7 lbs). Negative for fever.  Gastrointestinal:  Positive for diarrhea (intemrittent with regular stools) and nausea (2 days ago, now resolved). Negative for anorexia, flatus, hematochezia, melena and vomiting.  Genitourinary:  Negative for dysuria, frequency and hematuria.  Musculoskeletal:  Negative for  arthralgias and myalgias.  Neurological:  Positive for headaches.        Objective:    BP 128/66   Pulse 65   Temp 97.7 F (36.5 C) (Oral)   Resp 20   Ht 6' (1.829 m)   Wt 170 lb (77.1 kg)   SpO2 96%   BMI 23.06 kg/m    Physical Exam Vitals and nursing note reviewed.  Constitutional:      General: He is not in acute distress.    Appearance: Normal appearance. He is not ill-appearing, toxic-appearing or diaphoretic.  HENT:     Mouth/Throat:     Mouth: Mucous membranes are moist.     Pharynx: Oropharynx is clear.  Eyes:     General: No scleral icterus.       Right eye: No discharge.        Left eye: No discharge.  Cardiovascular:     Rate and Rhythm: Normal rate and regular rhythm.     Heart sounds: Normal heart sounds. No murmur heard. Pulmonary:     Effort: Pulmonary effort is normal. No respiratory distress.     Breath sounds: Normal breath sounds. No wheezing, rhonchi or rales.  Chest:     Chest wall: No tenderness.  Abdominal:     General: Abdomen is flat. Bowel sounds are normal.     Palpations: Abdomen is soft. There is no shifting dullness, fluid wave or hepatomegaly.     Tenderness: There is no abdominal tenderness. There is no guarding or rebound. Negative signs include Murphy's sign and McBurney's sign.  Musculoskeletal:     Cervical back: Neck supple. No  rigidity.     Right lower leg: No edema.     Left lower leg: No edema.  Skin:    General: Skin is warm and dry.     Coloration: Skin is not jaundiced.  Neurological:     General: No focal deficit present.     Mental Status: He is alert and oriented to person, place, and time.     Motor: No weakness.     Gait: Gait normal.  Psychiatric:        Mood and Affect: Mood normal.        Behavior: Behavior normal.        Thought Content: Thought content normal.        Judgment: Judgment normal.     No results found for any visits on 10/06/22.      Assessment & Plan:   Garnet was seen today for  abdominal pain and diarrhea.  Diagnoses and all orders for this visit:  Generalized abdominal pain Diarrhea, unspecified type Symptoms started after exposure to illegal mushroom (not ingested). Benign exam today. Will check labs as below. Will also send of Covid test as his son has GI virus symptoms 8 days prior. Will notify patient of results and plan of care pending labs. Discussed symptomatic care and return precautions.  -     Novel Coronavirus, NAA ([facility]); Future -     Urinalysis, Routine w reflex microscopic -     PT AND PTT -     CMP14+EGFR -     CBC with Differential/Platelet -     CK   Return if symptoms worsen or fail to improve.  The patient indicates understanding of these issues and agrees with the plan.  Gwenlyn Perking, FNP

## 2022-10-06 NOTE — Patient Instructions (Signed)

## 2022-10-07 LAB — CBC WITH DIFFERENTIAL/PLATELET
Basophils Absolute: 0.1 10*3/uL (ref 0.0–0.2)
Basos: 1 %
EOS (ABSOLUTE): 1.3 10*3/uL — ABNORMAL HIGH (ref 0.0–0.4)
Eos: 20 %
Hematocrit: 47.5 % (ref 37.5–51.0)
Hemoglobin: 15.6 g/dL (ref 13.0–17.7)
Immature Grans (Abs): 0 10*3/uL (ref 0.0–0.1)
Immature Granulocytes: 0 %
Lymphocytes Absolute: 2.4 10*3/uL (ref 0.7–3.1)
Lymphs: 37 %
MCH: 29.6 pg (ref 26.6–33.0)
MCHC: 32.8 g/dL (ref 31.5–35.7)
MCV: 90 fL (ref 79–97)
Monocytes Absolute: 0.5 10*3/uL (ref 0.1–0.9)
Monocytes: 7 %
Neutrophils Absolute: 2.3 10*3/uL (ref 1.4–7.0)
Neutrophils: 35 %
Platelets: 220 10*3/uL (ref 150–450)
RBC: 5.27 x10E6/uL (ref 4.14–5.80)
RDW: 12.3 % (ref 11.6–15.4)
WBC: 6.5 10*3/uL (ref 3.4–10.8)

## 2022-10-07 LAB — CMP14+EGFR
ALT: 28 IU/L (ref 0–44)
AST: 24 IU/L (ref 0–40)
Albumin/Globulin Ratio: 1.9 (ref 1.2–2.2)
Albumin: 4.5 g/dL (ref 4.1–5.1)
Alkaline Phosphatase: 56 IU/L (ref 44–121)
BUN/Creatinine Ratio: 19 (ref 9–20)
BUN: 25 mg/dL — ABNORMAL HIGH (ref 6–20)
Bilirubin Total: 0.5 mg/dL (ref 0.0–1.2)
CO2: 23 mmol/L (ref 20–29)
Calcium: 9.4 mg/dL (ref 8.7–10.2)
Chloride: 97 mmol/L (ref 96–106)
Creatinine, Ser: 1.3 mg/dL — ABNORMAL HIGH (ref 0.76–1.27)
Globulin, Total: 2.4 g/dL (ref 1.5–4.5)
Glucose: 90 mg/dL (ref 70–99)
Potassium: 4 mmol/L (ref 3.5–5.2)
Sodium: 135 mmol/L (ref 134–144)
Total Protein: 6.9 g/dL (ref 6.0–8.5)
eGFR: 74 mL/min/{1.73_m2} (ref >59)

## 2022-10-07 LAB — PT AND PTT
INR: 1 (ref 0.9–1.2)
Prothrombin Time: 11.3 s (ref 9.1–12.0)
aPTT: 30 s (ref 24–33)

## 2022-10-07 LAB — CK: Total CK: 92 U/L (ref 49–439)

## 2022-10-07 LAB — NOVEL CORONAVIRUS, NAA: SARS-CoV-2, NAA: NOT DETECTED

## 2022-10-08 ENCOUNTER — Telehealth: Payer: Self-pay | Admitting: Family Medicine

## 2022-10-08 ENCOUNTER — Other Ambulatory Visit: Payer: Self-pay | Admitting: *Deleted

## 2022-10-08 DIAGNOSIS — R799 Abnormal finding of blood chemistry, unspecified: Secondary | ICD-10-CM

## 2022-10-08 NOTE — Telephone Encounter (Signed)
Pt aware of results and recommendations and voiced understanding. 

## 2022-10-09 ENCOUNTER — Other Ambulatory Visit: Payer: Commercial Managed Care - PPO

## 2022-10-09 DIAGNOSIS — R799 Abnormal finding of blood chemistry, unspecified: Secondary | ICD-10-CM

## 2022-10-09 LAB — BMP8+EGFR
BUN/Creatinine Ratio: 18 (ref 9–20)
BUN: 23 mg/dL — ABNORMAL HIGH (ref 6–20)
CO2: 22 mmol/L (ref 20–29)
Calcium: 9.4 mg/dL (ref 8.7–10.2)
Chloride: 100 mmol/L (ref 96–106)
Creatinine, Ser: 1.28 mg/dL — ABNORMAL HIGH (ref 0.76–1.27)
Glucose: 88 mg/dL (ref 70–99)
Potassium: 4.2 mmol/L (ref 3.5–5.2)
Sodium: 138 mmol/L (ref 134–144)
eGFR: 76 mL/min/{1.73_m2} (ref >59)

## 2023-03-17 ENCOUNTER — Ambulatory Visit (INDEPENDENT_AMBULATORY_CARE_PROVIDER_SITE_OTHER): Payer: Commercial Managed Care - PPO | Admitting: Family Medicine

## 2023-03-17 ENCOUNTER — Encounter: Payer: Self-pay | Admitting: Family Medicine

## 2023-03-17 VITALS — BP 104/46 | HR 64 | Temp 98.2°F | Ht 72.0 in | Wt 173.0 lb

## 2023-03-17 DIAGNOSIS — M545 Low back pain, unspecified: Secondary | ICD-10-CM

## 2023-03-17 MED ORDER — BETAMETHASONE SOD PHOS & ACET 6 (3-3) MG/ML IJ SUSP
6.0000 mg | Freq: Once | INTRAMUSCULAR | Status: AC
  Administered 2023-03-17: 6 mg via INTRAMUSCULAR

## 2023-03-17 MED ORDER — PREDNISONE 10 MG PO TABS: ORAL_TABLET | ORAL | 0 refills | Status: DC

## 2023-03-17 MED ORDER — TIZANIDINE HCL 4 MG PO TABS: 4.0000 mg | ORAL_TABLET | Freq: Four times a day (QID) | ORAL | 1 refills | Status: DC | PRN

## 2023-03-17 NOTE — Patient Instructions (Signed)
Back Exercises The following exercises strengthen the muscles that help to support the trunk (torso) and back. They also help to keep the lower back flexible. Doing these exercises can help to prevent or lessen existing low back pain. If you have back pain or discomfort, try doing these exercises 2-3 times each day or as told by your health care provider. As your pain improves, do them once each day, but increase the number of times that you repeat the steps for each exercise (do more repetitions). To prevent the recurrence of back pain, continue to do these exercises once each day or as told by your health care provider. Do exercises exactly as told by your health care provider and adjust them as directed. It is normal to feel mild stretching, pulling, tightness, or discomfort as you do these exercises, but you should stop right away if you feel sudden pain or your pain gets worse. Exercises Single knee to chest Repeat these steps 3-5 times for each leg: Lie on your back on a firm bed or the floor with your legs extended. 

## 2023-03-17 NOTE — Progress Notes (Signed)
Subjective:  Patient ID: Christian Lang, male    DOB: 1991/09/09  Age: 32 y.o. MRN: [id number]  CC: Back Pain   HPI Christian Lang presents for pulled back in gym twice recently. Teledoc gave diclofenac. Onset 14-15 days ago. Saw chirocractor yesterday. Was told he was swollen. Needs something stronger than diclofenac. Pain is moderate to severe. Positional. No radiation. Location across the lower lumbar region.      03/17/2023    3:43 PM 10/06/2022    9:38 AM 10/30/2021   10:41 AM  Depression screen PHQ 2/9  Decreased Interest 0 0 0  Down, Depressed, Hopeless 0 0 0  PHQ - 2 Score 0 0 0  Altered sleeping  0 1  Tired, decreased energy  0 0  Change in appetite  0 1  Feeling bad or failure about yourself   0 0  Trouble concentrating  0 1  Moving slowly or fidgety/restless  0 1  Suicidal thoughts  0 0  PHQ-9 Score  0 4  Difficult doing work/chores   Not difficult at all    History Christian Lang has a past medical history of COVID-19 (04/2020) and TBI (traumatic brain injury) (HCC).   He has a past surgical history that includes Shoulder arthroscopy (Left).   His family history includes Heart attack in his paternal grandfather.He reports that he has never smoked. He has never used smokeless tobacco. He reports that he does not drink alcohol and does not use drugs.    ROS Review of Systems  Constitutional:  Positive for activity change.  Musculoskeletal:  Positive for back pain.    Objective:  BP (!) 104/46   Pulse 64   Temp 98.2 F (36.8 C)   Ht 6' (1.829 m)   Wt 173 lb (78.5 kg)   SpO2 95%   BMI 23.46 kg/m   BP Readings from Last 3 Encounters:  03/17/23 (!) 104/46  10/06/22 128/66  10/30/21 109/64    Wt Readings from Last 3 Encounters:  03/17/23 173 lb (78.5 kg)  10/06/22 170 lb (77.1 kg)  10/30/21 168 lb (76.2 kg)     Physical Exam Vitals reviewed.  Constitutional:      Appearance: He is well-developed.  HENT:     Head: Normocephalic and  atraumatic.     Right Ear: External ear normal.     Left Ear: External ear normal.     Mouth/Throat:     Pharynx: No oropharyngeal exudate or posterior oropharyngeal erythema.  Eyes:     Pupils: Pupils are equal, round, and reactive to light.  Cardiovascular:     Rate and Rhythm: Normal rate and regular rhythm.     Heart sounds: No murmur heard. Pulmonary:     Effort: No respiratory distress.     Breath sounds: Normal breath sounds.  Musculoskeletal:     Cervical back: Normal range of motion and neck supple.  Neurological:     Mental Status: He is alert and oriented to person, place, and time.       Assessment & Plan:   Christian Lang was seen today for back pain.  Diagnoses and all orders for this visit:  Acute midline low back pain without sciatica -     betamethasone acetate-betamethasone sodium phosphate (CELESTONE) injection 6 mg -     Ambulatory referral to Physical Therapy  Other orders -     predniSONE (DELTASONE) 10 MG tablet; Take 5 daily for 3 days followed by 4,3,2 and 1 for 3  days each. -     tiZANidine (ZANAFLEX) 4 MG tablet; Take 1 tablet (4 mg total) by mouth every 6 (six) hours as needed for muscle spasms.       I have discontinued Anna Genre. Kritikos's nitroGLYCERIN, cyclobenzaprine, ipratropium, ibuprofen, albuterol, and promethazine-dextromethorphan. I am also having him start on predniSONE and tiZANidine. We administered betamethasone acetate-betamethasone sodium phosphate.  Allergies as of 03/17/2023       Reactions   Hydrocodone Hives        Medication List        Accurate as of March 17, 2023 11:30 PM. If you have any questions, ask your nurse or doctor.          STOP taking these medications    albuterol 108 (90 Base) MCG/ACT inhaler Commonly known as: VENTOLIN HFA Stopped by: Mechele Claude, MD   cyclobenzaprine 5 MG tablet Commonly known as: FLEXERIL Stopped by: Mechele Claude, MD   ibuprofen 800 MG tablet Commonly known as:  ADVIL Stopped by: Mechele Claude, MD   ipratropium 0.03 % nasal spray Commonly known as: ATROVENT Stopped by: Mechele Claude, MD   nitroGLYCERIN 0.4 MG SL tablet Commonly known as: NITROSTAT Stopped by: Mechele Claude, MD   promethazine-dextromethorphan 6.25-15 MG/5ML syrup Commonly known as: PROMETHAZINE-DM Stopped by: Mechele Claude, MD       TAKE these medications    predniSONE 10 MG tablet Commonly known as: DELTASONE Take 5 daily for 3 days followed by 4,3,2 and 1 for 3 days each. Started by: Mechele Claude, MD   tiZANidine 4 MG tablet Commonly known as: ZANAFLEX Take 1 tablet (4 mg total) by mouth every 6 (six) hours as needed for muscle spasms. Started by: Mechele Claude, MD         Follow-up: No follow-ups on file.  Mechele Claude, M.D.
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
## 2023-04-16 ENCOUNTER — Ambulatory Visit (HOSPITAL_COMMUNITY): Payer: Commercial Managed Care - PPO | Attending: Physical Therapy | Admitting: Physical Therapy

## 2023-04-17 ENCOUNTER — Encounter (HOSPITAL_COMMUNITY): Payer: Self-pay

## 2023-04-17 ENCOUNTER — Other Ambulatory Visit: Payer: Self-pay

## 2023-04-17 ENCOUNTER — Emergency Department (HOSPITAL_COMMUNITY)
Admission: EM | Admit: 2023-04-17 | Discharge: 2023-04-18 | Disposition: A | Discharge status: Home / Self Care | Payer: Commercial Managed Care - PPO | Attending: Emergency Medicine | Admitting: Emergency Medicine

## 2023-04-17 ENCOUNTER — Emergency Department (HOSPITAL_COMMUNITY): Payer: Commercial Managed Care - PPO

## 2023-04-17 DIAGNOSIS — D72829 Elevated white blood cell count, unspecified: Secondary | ICD-10-CM | POA: Diagnosis not present

## 2023-04-17 DIAGNOSIS — N3000 Acute cystitis without hematuria: Secondary | ICD-10-CM | POA: Diagnosis not present

## 2023-04-17 DIAGNOSIS — R309 Painful micturition, unspecified: Secondary | ICD-10-CM | POA: Diagnosis present

## 2023-04-17 LAB — CBC WITH DIFFERENTIAL/PLATELET
Abs Immature Granulocytes: 0.06 10*3/uL (ref 0.00–0.07)
Basophils Absolute: 0 10*3/uL (ref 0.0–0.1)
Basophils Relative: 0 %
Eosinophils Absolute: 0 10*3/uL (ref 0.0–0.5)
Eosinophils Relative: 0 %
HCT: 45.4 % (ref 39.0–52.0)
Hemoglobin: 15.3 g/dL (ref 13.0–17.0)
Immature Granulocytes: 1 %
Lymphocytes Relative: 8 %
Lymphs Abs: 1 10*3/uL (ref 0.7–4.0)
MCH: 29.9 pg (ref 26.0–34.0)
MCHC: 33.7 g/dL (ref 30.0–36.0)
MCV: 88.7 fL (ref 80.0–100.0)
Monocytes Absolute: 1 10*3/uL (ref 0.1–1.0)
Monocytes Relative: 8 %
Neutro Abs: 10.3 10*3/uL — ABNORMAL HIGH (ref 1.7–7.7)
Neutrophils Relative %: 83 %
Platelets: 219 10*3/uL (ref 150–400)
RBC: 5.12 MIL/uL (ref 4.22–5.81)
RDW: 12.8 % (ref 11.5–15.5)
WBC: 12.4 10*3/uL — ABNORMAL HIGH (ref 4.0–10.5)
nRBC: 0 % (ref 0.0–0.2)

## 2023-04-17 LAB — URINALYSIS, ROUTINE W REFLEX MICROSCOPIC
Bilirubin Urine: NEGATIVE
Glucose, UA: NEGATIVE mg/dL
Ketones, ur: 20 mg/dL — AB
Nitrite: NEGATIVE
Protein, ur: 30 mg/dL — AB
Specific Gravity, Urine: 1.024 (ref 1.005–1.030)
WBC, UA: >50 WBC/hpf (ref 0–5)
pH: 5 (ref 5.0–8.0)

## 2023-04-17 LAB — MAGNESIUM: Magnesium: 1.6 mg/dL — ABNORMAL LOW (ref 1.7–2.4)

## 2023-04-17 LAB — COMPREHENSIVE METABOLIC PANEL
ALT: 28 U/L (ref 0–44)
AST: 22 U/L (ref 15–41)
Albumin: 4.3 g/dL (ref 3.5–5.0)
Alkaline Phosphatase: 46 U/L (ref 38–126)
Anion gap: 10 (ref 5–15)
BUN: 15 mg/dL (ref 6–20)
CO2: 26 mmol/L (ref 22–32)
Calcium: 9.2 mg/dL (ref 8.9–10.3)
Chloride: 96 mmol/L — ABNORMAL LOW (ref 98–111)
Creatinine, Ser: 1.38 mg/dL — ABNORMAL HIGH (ref 0.61–1.24)
GFR, Estimated: >60 mL/min (ref >60)
Glucose, Bld: 113 mg/dL — ABNORMAL HIGH (ref 70–99)
Potassium: 3.8 mmol/L (ref 3.5–5.1)
Sodium: 132 mmol/L — ABNORMAL LOW (ref 135–145)
Total Bilirubin: 0.9 mg/dL (ref 0.3–1.2)
Total Protein: 7.8 g/dL (ref 6.5–8.1)

## 2023-04-17 LAB — LACTIC ACID, PLASMA: Lactic Acid, Venous: 1.3 mmol/L (ref 0.5–1.9)

## 2023-04-17 MED ORDER — ONDANSETRON HCL 4 MG/2ML IJ SOLN
4.0000 mg | Freq: Once | INTRAMUSCULAR | Status: AC
  Administered 2023-04-17: 4 mg via INTRAVENOUS
  Filled 2023-04-17: qty 2

## 2023-04-17 MED ORDER — ACETAMINOPHEN 500 MG PO TABS
1000.0000 mg | ORAL_TABLET | Freq: Once | ORAL | Status: AC
  Administered 2023-04-17: 1000 mg via ORAL
  Filled 2023-04-17: qty 2

## 2023-04-17 MED ORDER — ONDANSETRON 4 MG PO TBDP
4.0000 mg | ORAL_TABLET | Freq: Once | ORAL | Status: AC
  Administered 2023-04-17: 4 mg via ORAL
  Filled 2023-04-17: qty 1

## 2023-04-17 MED ORDER — MAGNESIUM SULFATE 2 GM/50ML IV SOLN
2.0000 g | Freq: Once | INTRAVENOUS | Status: AC
  Administered 2023-04-17: 2 g via INTRAVENOUS
  Filled 2023-04-17: qty 50

## 2023-04-17 MED ORDER — SODIUM CHLORIDE 0.9 % IV BOLUS
1000.0000 mL | Freq: Once | INTRAVENOUS | Status: AC
  Administered 2023-04-17: 1000 mL via INTRAVENOUS

## 2023-04-17 MED ORDER — CEFDINIR 300 MG PO CAPS: 300.0000 mg | ORAL_CAPSULE | Freq: Two times a day (BID) | ORAL | 0 refills | Status: DC

## 2023-04-17 MED ORDER — SODIUM CHLORIDE 0.9 % IV SOLN
2.0000 g | Freq: Once | INTRAVENOUS | Status: AC
  Administered 2023-04-17: 2 g via INTRAVENOUS
  Filled 2023-04-17: qty 20

## 2023-04-17 MED ORDER — ONDANSETRON 4 MG PO TBDP: 4.0000 mg | ORAL_TABLET | Freq: Three times a day (TID) | ORAL | 0 refills | Status: DC | PRN

## 2023-04-17 MED ORDER — OXYCODONE HCL 5 MG PO TABS
5.0000 mg | ORAL_TABLET | Freq: Once | ORAL | Status: AC
  Administered 2023-04-17: 5 mg via ORAL
  Filled 2023-04-17: qty 1

## 2023-04-17 NOTE — ED Triage Notes (Signed)
B/L flank pain that radiates to penis  Chills, HA, hurts to urinate, urine frequency, spasms in bladder.Started last night

## 2023-04-17 NOTE — ED Provider Notes (Signed)
[place] EMERGENCY DEPARTMENT AT [facility] Provider Note   CSN: [id number] Arrival date & time: 04/17/23  1642     History  Chief Complaint  Patient presents with   Flank Pain    Christian Lang is a 32 y.o. male.  Patient complains of discomfort with urination today.  Patient complains of feeling nauseated.  Patient is concerned that he has a urinary tract infection.  Patient denies any risk of anything sexually transmitted.  She reports he has been having pain across his low back.  The history is provided by the patient and the spouse. No language interpreter was used.  Flank Pain This is a new problem. The current episode started 12 to 24 hours ago. The problem occurs constantly. The problem has been gradually worsening. Nothing aggravates the symptoms. Nothing relieves the symptoms. He has tried nothing for the symptoms.       Home Medications Prior to Admission medications   Medication Sig Start Date End Date Taking? Authorizing Provider  cefdinir (OMNICEF) 300 MG capsule Take 1 capsule (300 mg total) by mouth 2 (two) times daily. 04/17/23  Yes Cheron Schaumann K, PA-C  ondansetron (ZOFRAN-ODT) 4 MG disintegrating tablet Take 1 tablet (4 mg total) by mouth every 8 (eight) hours as needed for nausea or vomiting. 04/17/23  Yes Cheron Schaumann K, PA-C  predniSONE (DELTASONE) 10 MG tablet Take 5 daily for 3 days followed by 4,3,2 and 1 for 3 days each. 03/17/23   Mechele Claude, MD  tiZANidine (ZANAFLEX) 4 MG tablet Take 1 tablet (4 mg total) by mouth every 6 (six) hours as needed for muscle spasms. 03/17/23   Mechele Claude, MD      Allergies    Hydrocodone and Oxycodone    Review of Systems   Review of Systems  Genitourinary:  Positive for flank pain.  All other systems reviewed and are negative.   Physical Exam Updated Vital Signs BP 122/72 (BP Location: Left Arm)   Pulse (!) 101   Temp 99.3 F (37.4 C) (Oral)   Resp (!) 21   Ht 6' (1.829 m)   Wt 77.1 kg    SpO2 100%   BMI 23.06 kg/m  Physical Exam Vitals and nursing note reviewed.  Constitutional:      Appearance: He is well-developed.  HENT:     Head: Normocephalic.  Cardiovascular:     Rate and Rhythm: Normal rate.  Pulmonary:     Effort: Pulmonary effort is normal.  Abdominal:     General: Abdomen is flat. There is no distension.     Palpations: Abdomen is soft.  Musculoskeletal:        General: Normal range of motion.     Cervical back: Normal range of motion.  Skin:    General: Skin is warm.  Neurological:     General: No focal deficit present.     Mental Status: He is alert and oriented to person, place, and time.     ED Results / Procedures / Treatments   Labs (all labs ordered are listed, but only abnormal results are displayed) Labs Reviewed  URINALYSIS, ROUTINE W REFLEX MICROSCOPIC - Abnormal; Notable for the following components:      Result Value   APPearance HAZY (*)    Hgb urine dipstick MODERATE (*)    Ketones, ur 20 (*)    Protein, ur 30 (*)    Leukocytes,Ua MODERATE (*)    Bacteria, UA RARE (*)    All other  components within normal limits  CBC WITH DIFFERENTIAL/PLATELET - Abnormal; Notable for the following components:   WBC 12.4 (*)    Neutro Abs 10.3 (*)    All other components within normal limits  COMPREHENSIVE METABOLIC PANEL - Abnormal; Notable for the following components:   Sodium 132 (*)    Chloride 96 (*)    Glucose, Bld 113 (*)    Creatinine, Ser 1.38 (*)    All other components within normal limits  MAGNESIUM - Abnormal; Notable for the following components:   Magnesium 1.6 (*)    All other components within normal limits  LACTIC ACID, PLASMA  URINALYSIS, W/ REFLEX TO CULTURE (INFECTION SUSPECTED)  GC/CHLAMYDIA PROBE AMP ([place]) NOT AT [facility]    EKG None  Radiology CT Renal Stone Study  Result Date: 04/17/2023 CLINICAL DATA:  Bilateral flank pain EXAM: CT ABDOMEN AND PELVIS WITHOUT CONTRAST TECHNIQUE: Multidetector CT  imaging of the abdomen and pelvis was performed following the standard protocol without IV contrast. RADIATION DOSE REDUCTION: This exam was performed according to the departmental dose-optimization program which includes automated exposure control, adjustment of the mA and/or kV according to patient size and/or use of iterative reconstruction technique. COMPARISON:  None Available. FINDINGS: Lower chest: No acute abnormality Hepatobiliary: No focal hepatic abnormality. Gallbladder unremarkable. Pancreas: No focal abnormality or ductal dilatation. Spleen: No focal abnormality.  Normal size. Adrenals/Urinary Tract: No adrenal abnormality. No focal renal abnormality. No stones or hydronephrosis. Urinary bladder is unremarkable. Stomach/Bowel: Normal appendix. Stomach, large and small bowel grossly unremarkable. Vascular/Lymphatic: No evidence of aneurysm or adenopathy. Reproductive: No visible focal abnormality. Other: No free fluid or free air. Musculoskeletal: No acute bony abnormality. IMPRESSION: No renal or ureteral stones.  No hydronephrosis. Normal appendix. No acute findings. Electronically Signed   By: Charlett Nose M.D.   On: 04/17/2023 18:07    Procedures Procedures    Medications Ordered in ED Medications  acetaminophen (TYLENOL) tablet 1,000 mg (1,000 mg Oral Given 04/17/23 1737)  oxyCODONE (Oxy IR/ROXICODONE) immediate release tablet 5 mg (5 mg Oral Given 04/17/23 1753)  ondansetron (ZOFRAN-ODT) disintegrating tablet 4 mg (4 mg Oral Given 04/17/23 1753)  cefTRIAXone (ROCEPHIN) 2 g in sodium chloride 0.9 % 100 mL IVPB (0 g Intravenous Stopped 04/17/23 2113)  acetaminophen (TYLENOL) tablet 1,000 mg (1,000 mg Oral Given 04/17/23 2247)  sodium chloride 0.9 % bolus 1,000 mL (1,000 mLs Intravenous New Bag/Given 04/17/23 2259)  magnesium sulfate IVPB 2 g 50 mL (0 g Intravenous Stopped 04/17/23 2321)  sodium chloride 0.9 % bolus 1,000 mL (1,000 mLs Intravenous New Bag/Given 04/17/23 2258)  ondansetron (ZOFRAN)  injection 4 mg (4 mg Intravenous Given 04/17/23 2317)    ED Course/ Medical Decision Making/ A&P                                 Medical Decision Making Patient complains of discomfort with urination.  Patient reports he is having frequent urge to urinate with very little output.  Amount and/or Complexity of Data Reviewed Independent Historian: spouse    Details: Is here with his wife who is supportive Labs: ordered. Decision-making details documented in ED Course.    Details: UA shows white blood cell greater than 50.  Patient has a white blood cell count of 12.4. Radiology: ordered and independent interpretation performed. Decision-making details documented in ED Course.    Details: CT renal shows no stone.  Risk OTC drugs. Prescription drug  management. Risk Details: Patient given IV Rocephin x 2 g.  Patient is given Tylenol and oxycodone with relief of discomfort.  Patient had relief of nausea with Zofran.  Patient is given prescription for Omnicef.  Patient is given prescription for Zofran he is advised Tylenol every 4 hours.  Patient is advised to return to the emergency department if pain worsens or chills.           Final Clinical Impression(s) / ED Diagnoses Final diagnoses:  Acute cystitis without hematuria    Rx / DC Orders ED Discharge Orders          Ordered    ondansetron (ZOFRAN-ODT) 4 MG disintegrating tablet  Every 8 hours PRN        04/17/23 2323    cefdinir (OMNICEF) 300 MG capsule  2 times daily        04/17/23 2323           An After Visit Summary was printed and given to the patient.    Elson Areas, PA-C 04/17/23 2354    Sloan Leiter, DO 04/18/23 [id number]

## 2023-04-17 NOTE — ED Notes (Signed)
Patient transported to CT 

## 2023-04-20 ENCOUNTER — Telehealth: Payer: Self-pay

## 2023-04-20 NOTE — Telephone Encounter (Signed)
Transition Care Management Follow-up Telephone Call Date of discharge and from where: 04/17/23 from [facility] ER How have you been since you were released from the hospital? Feeling much better Any questions or concerns? No  Items Reviewed: Did the pt receive and understand the discharge instructions provided? Yes  Medications obtained and verified? Yes  Other?  N/A Any new allergies since your discharge? No  Dietary orders reviewed? Yes Do you have support at home? Yes   Home Care and Equipment/Supplies: Were home health services ordered? no If so, what is the name of the agency? N/A  Has the agency set up a time to come to the patient's home? not applicable Were any new equipment or medical supplies ordered?  No;, N/A What is the name of the medical supply agency? N/A Were you able to get the supplies/equipment? not applicable Do you have any questions related to the use of the equipment or supplies? No, N/A  Functional Questionnaire: (I = Independent and D = Dependent) ADLs: I  Bathing/Dressing- I  Meal Prep- I  Eating- I  Maintaining continence- I  Transferring/Ambulation- I  Managing Meds- I  Follow up appointments reviewed:  PCP Hospital f/u appt confirmed? No patient did not wish to schedule ER follow up.  Reports he feels great but will call back if anything changes. Specialist Hospital f/u appt confirmed?  N/A   Are transportation arrangements needed? No  If their condition worsens, is the pt aware to call PCP or go to the Emergency Dept.? Yes Was the patient provided with contact information for the PCP's office or ED? Yes Was to pt encouraged to call back with questions or concerns? Yes

## 2024-02-25 ENCOUNTER — Emergency Department (HOSPITAL_COMMUNITY)

## 2024-02-25 ENCOUNTER — Other Ambulatory Visit: Payer: Self-pay

## 2024-02-25 ENCOUNTER — Encounter (HOSPITAL_COMMUNITY): Payer: Self-pay | Admitting: Emergency Medicine

## 2024-02-25 ENCOUNTER — Emergency Department (HOSPITAL_COMMUNITY)
Admission: EM | Admit: 2024-02-25 | Discharge: 2024-02-25 | Disposition: A | Discharge status: Home / Self Care | Attending: Emergency Medicine | Admitting: Emergency Medicine

## 2024-02-25 DIAGNOSIS — S9032XA Contusion of left foot, initial encounter: Secondary | ICD-10-CM | POA: Diagnosis not present

## 2024-02-25 DIAGNOSIS — W208XXA Other cause of strike by thrown, projected or falling object, initial encounter: Secondary | ICD-10-CM | POA: Diagnosis not present

## 2024-02-25 DIAGNOSIS — S99922A Unspecified injury of left foot, initial encounter: Secondary | ICD-10-CM

## 2024-02-25 DIAGNOSIS — Y9389 Activity, other specified: Secondary | ICD-10-CM | POA: Insufficient documentation

## 2024-02-25 NOTE — ED Triage Notes (Signed)
 Pt ambulatory to triage. Pt reports that he was at training and a 30 lb breaching ram got dropped on his left big toe. Pt reports that he took two Excedrin before coming. Pt reports pain with bending the toe.

## 2024-02-25 NOTE — ED Provider Notes (Signed)
 [place] EMERGENCY DEPARTMENT AT [facility] Provider Note   CSN: [id number] Arrival date & time: 02/25/24  1931     Patient presents with: Toe Injury   Christian Lang is a 33 y.o. male.   33 year old male previously healthy presents emergency department with left foot pain.  Patient reports that he was in training and dropped a 30 pound breaching ram on his left great toe.  Has been having pain on his MTP on that side.  Has been able to bear weight.  No surgeries on that leg.       Prior to Admission medications   Medication Sig Start Date End Date Taking? Authorizing Provider  cefdinir  (OMNICEF ) 300 MG capsule Take 1 capsule (300 mg total) by mouth 2 (two) times daily. 04/17/23   Sofia, Leslie K, PA-C  ondansetron  (ZOFRAN -ODT) 4 MG disintegrating tablet Take 1 tablet (4 mg total) by mouth every 8 (eight) hours as needed for nausea or vomiting. 04/17/23   Sofia, Leslie K, PA-C  predniSONE  (DELTASONE ) 10 MG tablet Take 5 daily for 3 days followed by 4,3,2 and 1 for 3 days each. 03/17/23   Roise Cleaver, MD  tiZANidine  (ZANAFLEX ) 4 MG tablet Take 1 tablet (4 mg total) by mouth every 6 (six) hours as needed for muscle spasms. 03/17/23   Roise Cleaver, MD    Allergies: Hydrocodone and Oxycodone     Review of Systems  Updated Vital Signs BP 138/85   Pulse 78   Temp 98.2 F (36.8 C) (Oral)   Resp 18   Ht 6' (1.829 m)   Wt 75.8 kg   SpO2 97%   BMI 22.65 kg/m   Physical Exam  Musculoskeletal:     Comments: Bruising and point tenderness to palpation over left foot MTP.  Good cap refill in toe distally.  Intact sensation light touch of toe distally.  DP pulse 2+.  PT pulse 2+.  No tenderness palpation of foot or ankle otherwise.     (all labs ordered are listed, but only abnormal results are displayed) Labs Reviewed - No data to display  EKG: None  Radiology: DG Foot Complete Left Result Date: 02/25/2024 CLINICAL DATA:  Left big toe pain after trauma 30  pound weight dropped on big toe. EXAM: LEFT FOOT - COMPLETE 3+ VIEW COMPARISON:  None Available. FINDINGS: There is no evidence of fracture or dislocation. There is no evidence of arthropathy or other focal bone abnormality. Soft tissues are unremarkable. IMPRESSION: Negative. Electronically Signed   By: Rozell Cornet M.D.   On: 02/25/2024 20:15     Procedures   Medications Ordered in the ED - No data to display                                  Medical Decision Making Amount and/or Complexity of Data Reviewed Radiology: ordered.   33 year old male who presents emergency department with left foot pain after dropping a battering ram on his toe  Initial Ddx:  Fracture, dislocation, contusion  MDM/Course:  Patient presents emergency department with bruising of his foot after dropping a battery gram on his foot.  Appears to be neurovascularly intact distally.  No obvious deformity.  Had an x-ray that did not show fracture or dislocation.  Suspect that he has some contusions that are causing his symptoms.  Instructed to elevate his foot and ice it.  Also instructed to use Tylenol  and ibuprofen   for pain.  Given a postop shoe for comfort.  Will have him follow-up with podiatry if his symptoms persist past 1 to 2 weeks.   This patient presents to the ED for concern of complaints listed in HPI, this involves an extensive number of treatment options, and is a complaint that carries with it a high risk of complications and morbidity. Disposition including potential need for admission considered.   Dispo: DC Home. Return precautions discussed including, but not limited to, those listed in the AVS. Allowed pt time to ask questions which were answered fully prior to dc.  Records reviewed Outpatient Clinic Notes I independently reviewed the following imaging with scope of interpretation limited to determining acute life threatening conditions related to emergency care: Extremity x-ray(s) and agree  with the radiologist interpretation with the following exceptions: none I have reviewed the patients home medications and made adjustments as needed  Portions of this note were generated with Dragon dictation software. Dictation errors may occur despite best attempts at proofreading.     Final diagnoses:  Injury of left foot, initial encounter    ED Discharge Orders     None          Ninetta Basket, MD 02/26/24 0005

## 2024-02-25 NOTE — Discharge Instructions (Addendum)
 You were seen for your foot injury in the emergency department.  There are no broken bones.  At home, please take tylenol  and ibuprofen  for your pain.  Please rest your joint and elevate it.  Use ice to limit the swelling.  You can use the postop shoe as needed for pain.  Check your MyChart online for the results of any tests that had not resulted by the time you left the emergency department.   Follow-up with podiatry in 1 week.  Return immediately to the emergency department if you experience any of the following: Worsening pain, or any other concerning symptoms.    Thank you for visiting our Emergency Department. It was a pleasure taking care of you today.

## 2024-02-25 NOTE — ED Notes (Signed)
 Pt declined post op shoe. Stated, I'm not going to wear it.

## 2024-05-10 ENCOUNTER — Ambulatory Visit (INDEPENDENT_AMBULATORY_CARE_PROVIDER_SITE_OTHER): Admitting: Nurse Practitioner

## 2024-05-10 ENCOUNTER — Encounter: Payer: Self-pay | Admitting: Nurse Practitioner

## 2024-05-10 VITALS — BP 106/67 | HR 63 | Temp 98.5°F | Ht 72.0 in | Wt 167.0 lb

## 2024-05-10 DIAGNOSIS — H6123 Impacted cerumen, bilateral: Secondary | ICD-10-CM

## 2024-05-10 NOTE — Progress Notes (Signed)
   Subjective:    Patient ID: Christian Lang, male    DOB: August 13, 1991, 33 y.o.   MRN: [id number]   Chief Complaint: Ear Fullness   Ear Fullness  The current episode started more than 1 year ago. The problem occurs constantly. The pain is mild. Pertinent negatives include no abdominal pain, headaches or rash. history of ear wax build up    Patient Active Problem List   Diagnosis Date Noted   Encounter for removal of sutures 10/30/2021   Chest pain 07/19/2021       Review of Systems  Constitutional:  Negative for diaphoresis.  Eyes:  Negative for pain.  Respiratory:  Negative for shortness of breath.   Cardiovascular:  Negative for chest pain, palpitations and leg swelling.  Gastrointestinal:  Negative for abdominal pain.  Endocrine: Negative for polydipsia.  Skin:  Negative for rash.  Neurological:  Negative for dizziness, weakness and headaches.  Hematological:  Does not bruise/bleed easily.  All other systems reviewed and are negative.      Objective:   Physical Exam Constitutional:      Appearance: Normal appearance.  HENT:     Right Ear: There is impacted cerumen.     Left Ear: There is impacted cerumen.     Nose: No congestion.  Cardiovascular:     Rate and Rhythm: Normal rate and regular rhythm.     Heart sounds: Normal heart sounds.  Pulmonary:     Effort: Pulmonary effort is normal.     Breath sounds: Normal breath sounds.  Skin:    General: Skin is warm.  Neurological:     General: No focal deficit present.     Mental Status: He is alert and oriented to person, place, and time.  Psychiatric:        Mood and Affect: Mood normal.        Behavior: Behavior normal.    BP 106/67   Pulse 63   Temp 98.5 F (36.9 C)   Ht 6' (1.829 m)   Wt 167 lb (75.8 kg)   SpO2 96%   BMI 22.65 kg/m   S/P ear irrigation- TMS clear bil      Assessment & Plan:   [name] in today with chief complaint of Ear Fullness   1. Bilateral impacted  cerumen (Primary) Debrox 2-3x a week Do not use qtips RTOprn    The above assessment and management plan was discussed with the patient. The patient verbalized understanding of and has agreed to the management plan. Patient is aware to call the clinic if symptoms persist or worsen. Patient is aware when to return to the clinic for a follow-up visit. Patient educated on when it is appropriate to go to the emergency department.   [name], FNP

## 2024-05-10 NOTE — Patient Instructions (Signed)
 Earwax Buildup, Adult Your ears make something called earwax. It helps keep germs called bacteria away and protects the skin in your ears. Sometimes, too much earwax can build up. This can cause discomfort or make it harder to hear. What are the causes? Earwax buildup can happen when you have too much earwax in your ears. Earwax is made in the outer part of your ear canal. It's supposed to fall out in small amounts over time. But if your ears aren't able to clean themselves like they should, earwax can build up. What increases the risk? You're more likely to get earwax buildup if: You clean your ears with cotton swabs. You pick at your ears. You use earplugs or in-ear headphones a lot. You wear hearing aids. You may also be more likely to get it if: You're male. You're older. Your ears naturally make more earwax. You have narrow ear canals or extra hair in your ears. Your earwax is too thick or sticky. You have eczema. You're dehydrated. This means there's not enough fluid in your body. What are the signs or symptoms? Symptoms of earwax buildup include: Not being able to hear as well. A feeling of fullness in your ear. Feeling like your ear is plugged. Fluid coming from your ear. Ear pain or an itchy ear. Ringing in your ear. Coughing or problems with balance. How is this diagnosed? Earwax buildup may be diagnosed based on your symptoms, medical history, and an ear exam. During the exam, your health care provider will look into your ear with a tool called an otoscope. You may also have tests, such as a hearing test. How is this treated? Earwax buildup may be treated by: Using ear drops. Having the earwax removed by a provider. The provider may: Flush the ear with water. Use a tool called a curette that has a loop on the end. Use a suction device. Having surgery. This may be done in severe cases. Follow these instructions at home:  Cleaning your ears Clean your ears as told  by your provider. You can clean the outside of your ears with a washcloth or tissue. Do not overclean your ears. Do not put anything into your ear unless told. This includes cotton swabs. General instructions Take over-the-counter and prescription medicines only as told by your provider. Drink enough fluid to keep your pee (urine) pale yellow. This helps thin the earwax. If you have hearing aids, clean them as told. Keep all follow-up visits. If earwax builds up in your ears often or if you use hearing aids, ask your provider how often you should have your ears cleaned. Contact a health care provider if: Your ear pain gets worse. You have a fever. You have pus, blood, or other fluid coming from your ear. You have hearing loss. You have ringing in your ears that won't go away. You feel like the room is spinning. This is called vertigo. Your symptoms don't get better with treatment. This information is not intended to replace advice given to you by your health care provider. Make sure you discuss any questions you have with your health care provider. Document Revised: 11/13/2022 Document Reviewed: 11/13/2022 Elsevier Patient Education  2024 [employer].

## 2024-06-21 ENCOUNTER — Emergency Department (HOSPITAL_COMMUNITY)
Admission: EM | Admit: 2024-06-21 | Discharge: 2024-06-21 | Disposition: A | Discharge status: Home / Self Care | Payer: Worker's Compensation | Attending: Emergency Medicine | Admitting: Emergency Medicine

## 2024-06-21 ENCOUNTER — Encounter (HOSPITAL_COMMUNITY): Payer: Self-pay

## 2024-06-21 ENCOUNTER — Other Ambulatory Visit: Payer: Self-pay

## 2024-06-21 DIAGNOSIS — Z8616 Personal history of COVID-19: Secondary | ICD-10-CM | POA: Diagnosis not present

## 2024-06-21 DIAGNOSIS — Z042 Encounter for examination and observation following work accident: Secondary | ICD-10-CM | POA: Diagnosis present

## 2024-06-21 DIAGNOSIS — Z026 Encounter for examination for insurance purposes: Secondary | ICD-10-CM

## 2024-06-21 DIAGNOSIS — Y990 Civilian activity done for income or pay: Secondary | ICD-10-CM | POA: Insufficient documentation

## 2024-06-21 NOTE — ED Triage Notes (Signed)
 Pt BIB ems for drug screen. Pt denies complaints at this time.
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
## 2024-09-30 ENCOUNTER — Encounter: Payer: Self-pay | Admitting: Family Medicine

## 2024-09-30 ENCOUNTER — Ambulatory Visit (INDEPENDENT_AMBULATORY_CARE_PROVIDER_SITE_OTHER): Admitting: Family Medicine

## 2024-09-30 ENCOUNTER — Ambulatory Visit: Payer: Self-pay | Admitting: Family Medicine

## 2024-09-30 VITALS — BP 121/73 | HR 79 | Temp 98.0°F | Ht 73.0 in | Wt 174.2 lb

## 2024-09-30 DIAGNOSIS — J069 Acute upper respiratory infection, unspecified: Secondary | ICD-10-CM

## 2024-09-30 DIAGNOSIS — J988 Other specified respiratory disorders: Secondary | ICD-10-CM | POA: Diagnosis not present

## 2024-09-30 DIAGNOSIS — R051 Acute cough: Secondary | ICD-10-CM | POA: Diagnosis not present

## 2024-09-30 LAB — VERITOR SARS-COV-2 AND FLU A+B
BD Veritor SARS-CoV-2 Ag: NEGATIVE
Influenza A: NEGATIVE
Influenza B: NEGATIVE

## 2024-09-30 MED ORDER — FLUTICASONE PROPIONATE 50 MCG/ACT NA SUSP: 2.0000 | Freq: Every day | NASAL | 6 refills | Status: AC

## 2024-09-30 MED ORDER — CHLORPHEN-PE-ACETAMINOPHEN 4-10-325 MG PO TABS: 1.0000 | ORAL_TABLET | Freq: Four times a day (QID) | ORAL | 0 refills | Status: AC | PRN

## 2024-09-30 NOTE — Progress Notes (Signed)
 "    Subjective:  Patient ID: Christian Lang, male    DOB: 14-Nov-1990, 34 y.o.   MRN: [id number]  Patient Care Team: Joesph Annabella HERO, FNP as PCP - General (Family Medicine)   Chief Complaint:  Nasal Congestion and Cough (X 3-4 days- not taking anything otc )   HPI: Christian Lang is a 34 y.o. male presenting on 09/30/2024 for Nasal Congestion and Cough (X 3-4 days- not taking anything otc )    Christian Lang is a 35 year old male who presents with a deep chest cough and congestion.  He has been experiencing a deep chest cough for the past two to four days. The cough is particularly severe in the morning, causing significant difficulty in breathing and producing a large amount of phlegm upon waking. Throughout the day, the cough improves, occurring approximately once every twenty minutes, but it is mostly a dry, irritating cough with no significant sputum production. Symptoms tend to worsen again at night.  He has not taken any over-the-counter medications for his symptoms, although he uses elderberry as a home remedy. He denies using Tylenol , Mucinex, or any other medications to alleviate his symptoms.  No fever, nausea, diarrhea, ear pain, or headaches. He has a slight sore throat, which he attributes to drainage from congestion, and experiences mild shortness of breath at night.          Relevant past medical, surgical, family, and social history reviewed and updated as indicated.  Allergies and medications reviewed and updated. Data reviewed: Chart in Epic.   Past Medical History:  Diagnosis Date   COVID-19 04/2020   TBI (traumatic brain injury) [facility])     Past Surgical History:  Procedure Laterality Date   SHOULDER ARTHROSCOPY Left     Social History   Socioeconomic History   Marital status: Married    Spouse name: Not on file   Number of children: 1   Years of education: Not on file   Highest education level: Not on file  Occupational History    Occupation: emergency planning/management officer  Tobacco Use   Smoking status: Never   Smokeless tobacco: Never  Vaping Use   Vaping status: Never Used  Substance and Sexual Activity   Alcohol use: No   Drug use: No   Sexual activity: Not on file  Other Topics Concern   Not on file  Social History Narrative   Not on file   Social Drivers of Health   Tobacco Use: Low Risk (09/30/2024)   Patient History    Smoking Tobacco Use: Never    Smokeless Tobacco Use: Never    Passive Exposure: Not on file  Financial Resource Strain: Not on file  Food Insecurity: Not on file  Transportation Needs: Not on file  Physical Activity: Not on file  Stress: Not on file  Social Connections: Not on file  Intimate Partner Violence: Not on file  Depression (PHQ2-9): Low Risk (05/10/2024)   Depression (PHQ2-9)    PHQ-2 Score: 2  Alcohol Screen: Not on file  Housing: Not on file  Utilities: Not on file  Health Literacy: Not on file    Outpatient Encounter Medications as of 09/30/2024  Medication Sig   Chlorphen-PE-Acetaminophen  4-10-325 MG TABS Take 1 tablet by mouth every 6 (six) hours as needed.   fluticasone  (FLONASE ) 50 MCG/ACT nasal spray Place 2 sprays into both nostrils daily.   [DISCONTINUED] doxycycline (MONODOX) 100 MG capsule Take 100 mg by mouth 2 (two) times daily.   [  DISCONTINUED] ondansetron  (ZOFRAN -ODT) 4 MG disintegrating tablet Take 1 tablet (4 mg total) by mouth every 8 (eight) hours as needed for nausea or vomiting.   [DISCONTINUED] valACYclovir (VALTREX) 1000 MG tablet Take 1,000 mg by mouth every morning.   No facility-administered encounter medications on file as of 09/30/2024.    Allergies[1]  Pertinent ROS per HPI, otherwise unremarkable      Objective:  BP 121/73   Pulse 79   Temp 98 F (36.7 C)   Ht 6' 1 (1.854 m)   Wt 174 lb 3.2 oz (79 kg)   SpO2 97%   BMI 22.98 kg/m    Wt Readings from Last 3 Encounters:  09/30/24 174 lb 3.2 oz (79 kg)  05/10/24 167 lb (75.8 kg)   02/25/24 167 lb (75.8 kg)    Physical Exam Vitals and nursing note reviewed.  Constitutional:      General: He is not in acute distress.    Appearance: Normal appearance. He is normal weight. He is not ill-appearing, toxic-appearing or diaphoretic.  HENT:     Head: Normocephalic and atraumatic.     Right Ear: A middle ear effusion is present. Tympanic membrane is not erythematous.     Left Ear: A middle ear effusion is present. Tympanic membrane is not erythematous.     Nose: Congestion present. No rhinorrhea.     Right Turbinates: Not enlarged.     Left Turbinates: Enlarged.     Right Sinus: No maxillary sinus tenderness or frontal sinus tenderness.     Left Sinus: No maxillary sinus tenderness or frontal sinus tenderness.     Mouth/Throat:     Lips: Pink.     Mouth: Mucous membranes are moist.     Pharynx: Postnasal drip present. No oropharyngeal exudate or posterior oropharyngeal erythema.     Comments: Cobblestoning to posterior pharynx Eyes:     Conjunctiva/sclera: Conjunctivae normal.     Pupils: Pupils are equal, round, and reactive to light.  Cardiovascular:     Rate and Rhythm: Normal rate and regular rhythm.     Heart sounds: Normal heart sounds.  Pulmonary:     Effort: Pulmonary effort is normal.     Breath sounds: Normal breath sounds.  Musculoskeletal:     Cervical back: Neck supple.  Lymphadenopathy:     Cervical: No cervical adenopathy.  Skin:    General: Skin is warm and dry.     Capillary Refill: Capillary refill takes less than 2 seconds.  Neurological:     General: No focal deficit present.     Mental Status: He is alert and oriented to person, place, and time.  Psychiatric:        Mood and Affect: Mood normal.        Behavior: Behavior normal.        Thought Content: Thought content normal.        Judgment: Judgment normal.     Results for orders placed or performed during the hospital encounter of 04/17/23  CBC with Differential   Collection  Time: 04/17/23  5:07 PM  Result Value Ref Range   WBC 12.4 (H) 4.0 - 10.5 K/uL   RBC 5.12 4.22 - 5.81 MIL/uL   Hemoglobin 15.3 13.0 - 17.0 g/dL   HCT 54.5 60.9 - 47.9 %   MCV 88.7 80.0 - 100.0 fL   MCH 29.9 26.0 - 34.0 pg   MCHC 33.7 30.0 - 36.0 g/dL   RDW 87.1 88.4 - 84.4 %  Platelets 219 150 - 400 K/uL   nRBC 0.0 0.0 - 0.2 %   Neutrophils Relative % 83 %   Neutro Abs 10.3 (H) 1.7 - 7.7 K/uL   Lymphocytes Relative 8 %   Lymphs Abs 1.0 0.7 - 4.0 K/uL   Monocytes Relative 8 %   Monocytes Absolute 1.0 0.1 - 1.0 K/uL   Eosinophils Relative 0 %   Eosinophils Absolute 0.0 0.0 - 0.5 K/uL   Basophils Relative 0 %   Basophils Absolute 0.0 0.0 - 0.1 K/uL   Immature Granulocytes 1 %   Abs Immature Granulocytes 0.06 0.00 - 0.07 K/uL  Comprehensive metabolic panel   Collection Time: 04/17/23  5:07 PM  Result Value Ref Range   Sodium 132 (L) 135 - 145 mmol/L   Potassium 3.8 3.5 - 5.1 mmol/L   Chloride 96 (L) 98 - 111 mmol/L   CO2 26 22 - 32 mmol/L   Glucose, Bld 113 (H) 70 - 99 mg/dL   BUN 15 6 - 20 mg/dL   Creatinine, Ser 8.61 (H) 0.61 - 1.24 mg/dL   Calcium 9.2 8.9 - 89.6 mg/dL   Total Protein 7.8 6.5 - 8.1 g/dL   Albumin 4.3 3.5 - 5.0 g/dL   AST 22 15 - 41 U/L   ALT 28 0 - 44 U/L   Alkaline Phosphatase 46 38 - 126 U/L   Total Bilirubin 0.9 0.3 - 1.2 mg/dL   GFR, Estimated >39 >39 mL/min   Anion gap 10 5 - 15  Lactic acid, plasma   Collection Time: 04/17/23  5:07 PM  Result Value Ref Range   Lactic Acid, Venous 1.3 0.5 - 1.9 mmol/L  Magnesium    Collection Time: 04/17/23  5:07 PM  Result Value Ref Range   Magnesium  1.6 (L) 1.7 - 2.4 mg/dL  Urinalysis, Routine w reflex microscopic -Urine, Clean Catch   Collection Time: 04/17/23  5:55 PM  Result Value Ref Range   Color, Urine YELLOW YELLOW   APPearance HAZY (A) CLEAR   Specific Gravity, Urine 1.024 1.005 - 1.030   pH 5.0 5.0 - 8.0   Glucose, UA NEGATIVE NEGATIVE mg/dL   Hgb urine dipstick MODERATE (A) NEGATIVE    Bilirubin Urine NEGATIVE NEGATIVE   Ketones, ur 20 (A) NEGATIVE mg/dL   Protein, ur 30 (A) NEGATIVE mg/dL   Nitrite NEGATIVE NEGATIVE   Leukocytes,Ua MODERATE (A) NEGATIVE   RBC / HPF 6-10 0 - 5 RBC/hpf   WBC, UA >50 0 - 5 WBC/hpf   Bacteria, UA RARE (A) NONE SEEN   Squamous Epithelial / HPF 0-5 0 - 5 /HPF   Mucus PRESENT   GC/Chlamydia probe amp ([place])not at [facility]   Collection Time: 04/17/23 10:36 PM  Result Value Ref Range   Neisseria Gonorrhea Negative    Chlamydia Negative    Comment Normal Reference [place] Chlamydia - Negative    Comment      Normal Reference Range Neisseria Gonorrhea - Negative       Pertinent labs & imaging results that were available during my care of the patient were reviewed by me and considered in my medical decision making.  Assessment & Plan:  Koury was seen today for nasal congestion and cough.  Diagnoses and all orders for this visit:  URI with cough and congestion -     Veritor SARS-CoV-2 and Flu A+B -     Chlorphen-PE-Acetaminophen  4-10-325 MG TABS; Take 1 tablet by mouth every 6 (six) hours as needed. -  fluticasone  (FLONASE ) 50 MCG/ACT nasal spray; Place 2 sprays into both nostrils daily.  Acute cough -     Veritor SARS-CoV-2 and Flu A+B -     Chlorphen-PE-Acetaminophen  4-10-325 MG TABS; Take 1 tablet by mouth every 6 (six) hours as needed. -     fluticasone  (FLONASE ) 50 MCG/ACT nasal spray; Place 2 sprays into both nostrils daily.  Congestion of upper airway -     Veritor SARS-CoV-2 and Flu A+B -     Chlorphen-PE-Acetaminophen  4-10-325 MG TABS; Take 1 tablet by mouth every 6 (six) hours as needed. -     fluticasone  (FLONASE ) 50 MCG/ACT nasal spray; Place 2 sprays into both nostrils daily.      Acute upper respiratory infection Symptoms include a deep cough, congestion, and phlegm production, primarily in the morning and at night. No fever, nausea, diarrhea, or significant shortness of breath. Examination reveals thick  postnasal drainage and upper airway involvement. COVID and flu tests are negative. Likely viral etiology, consistent with a common cold. No signs of bronchitis or lower respiratory involvement. - Prescribed Norel for cough and congestion. - Prescribed Flonase  nasal spray for sinus congestion. - Advised increased water intake to help thin mucus. - Instructed to report if symptoms worsen or if fever develops.          Continue all other maintenance medications.  Follow up plan: Return if symptoms worsen or fail to improve.   Continue healthy lifestyle choices, including diet (rich in fruits, vegetables, and lean proteins, and low in salt and simple carbohydrates) and exercise (at least 30 minutes of moderate physical activity daily).  Educational handout given for URI  The above assessment and management plan was discussed with the patient. The patient verbalized understanding of and has agreed to the management plan. Patient is aware to call the clinic if they develop any new symptoms or if symptoms persist or worsen. Patient is aware when to return to the clinic for a follow-up visit. Patient educated on when it is appropriate to go to the emergency department.   Rosaline Bruns, FNP-C [facility] [phone]     [1]  Allergies Allergen Reactions   Hydrocodone Hives   Oxycodone     "
